# Patient Record
Sex: Female | Born: 1965 | Race: White | Hispanic: No | Marital: Married | Smoking: Current every day smoker
Health system: Southern US, Community
[De-identification: ages and names within clinical notes are randomized; demographics above are authoritative.]

## PROBLEM LIST (undated history)

## (undated) DIAGNOSIS — M797 Fibromyalgia: Secondary | ICD-10-CM

## (undated) DIAGNOSIS — F988 Other specified behavioral and emotional disorders with onset usually occurring in childhood and adolescence: Secondary | ICD-10-CM

## (undated) DIAGNOSIS — F1110 Opioid abuse, uncomplicated: Secondary | ICD-10-CM

## (undated) DIAGNOSIS — F419 Anxiety disorder, unspecified: Secondary | ICD-10-CM

## (undated) DIAGNOSIS — F4310 Post-traumatic stress disorder, unspecified: Secondary | ICD-10-CM

## (undated) DIAGNOSIS — J449 Chronic obstructive pulmonary disease, unspecified: Secondary | ICD-10-CM
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
## 2008-02-25 ENCOUNTER — Other Ambulatory Visit: Payer: Self-pay

## 2008-02-26 ENCOUNTER — Inpatient Hospital Stay: Payer: Self-pay | Admitting: Internal Medicine

## 2008-03-04 ENCOUNTER — Ambulatory Visit: Payer: Self-pay | Admitting: Urology

## 2008-03-13 ENCOUNTER — Ambulatory Visit: Payer: Self-pay | Admitting: Urology

## 2008-03-20 ENCOUNTER — Ambulatory Visit: Payer: Self-pay | Admitting: Urology

## 2008-04-04 ENCOUNTER — Ambulatory Visit: Payer: Self-pay | Admitting: Urology

## 2008-04-21 ENCOUNTER — Ambulatory Visit: Payer: Self-pay | Admitting: Urology

## 2008-05-01 ENCOUNTER — Ambulatory Visit: Payer: Self-pay | Admitting: Urology

## 2008-06-04 ENCOUNTER — Emergency Department: Payer: Self-pay | Admitting: Emergency Medicine

## 2011-03-13 ENCOUNTER — Emergency Department: Payer: Self-pay | Admitting: Emergency Medicine

## 2011-03-16 ENCOUNTER — Emergency Department: Payer: Self-pay | Admitting: Emergency Medicine

## 2012-02-10 ENCOUNTER — Ambulatory Visit: Payer: Self-pay

## 2013-04-29 ENCOUNTER — Ambulatory Visit: Payer: Self-pay

## 2013-07-30 ENCOUNTER — Ambulatory Visit: Payer: Self-pay | Admitting: Family Medicine

## 2013-09-21 DIAGNOSIS — F4310 Post-traumatic stress disorder, unspecified: Secondary | ICD-10-CM | POA: Insufficient documentation

## 2013-09-21 DIAGNOSIS — F329 Major depressive disorder, single episode, unspecified: Secondary | ICD-10-CM | POA: Insufficient documentation

## 2013-09-21 DIAGNOSIS — G4700 Insomnia, unspecified: Secondary | ICD-10-CM | POA: Insufficient documentation

## 2014-04-03 DIAGNOSIS — F909 Attention-deficit hyperactivity disorder, unspecified type: Secondary | ICD-10-CM | POA: Insufficient documentation

## 2014-05-29 DIAGNOSIS — F1390 Sedative, hypnotic, or anxiolytic use, unspecified, uncomplicated: Secondary | ICD-10-CM | POA: Insufficient documentation

## 2014-05-29 DIAGNOSIS — F1310 Sedative, hypnotic or anxiolytic abuse, uncomplicated: Secondary | ICD-10-CM

## 2014-05-29 DIAGNOSIS — F419 Anxiety disorder, unspecified: Secondary | ICD-10-CM | POA: Insufficient documentation

## 2014-09-18 ENCOUNTER — Ambulatory Visit: Payer: Self-pay | Admitting: Family Medicine

## 2015-11-27 DIAGNOSIS — J449 Chronic obstructive pulmonary disease, unspecified: Secondary | ICD-10-CM | POA: Insufficient documentation

## 2016-02-18 DIAGNOSIS — M47816 Spondylosis without myelopathy or radiculopathy, lumbar region: Secondary | ICD-10-CM | POA: Insufficient documentation

## 2016-02-18 DIAGNOSIS — M546 Pain in thoracic spine: Secondary | ICD-10-CM

## 2016-02-18 DIAGNOSIS — G8929 Other chronic pain: Secondary | ICD-10-CM | POA: Insufficient documentation

## 2016-09-06 ENCOUNTER — Emergency Department
Admission: EM | Admit: 2016-09-06 | Discharge: 2016-09-06 | Disposition: A | Discharge status: Home / Self Care | Payer: Medicaid Other | Attending: Emergency Medicine | Admitting: Emergency Medicine

## 2016-09-06 ENCOUNTER — Encounter: Payer: Self-pay | Admitting: Emergency Medicine

## 2016-09-06 ENCOUNTER — Emergency Department: Payer: Medicaid Other

## 2016-09-06 DIAGNOSIS — J441 Chronic obstructive pulmonary disease with (acute) exacerbation: Secondary | ICD-10-CM | POA: Diagnosis not present

## 2016-09-06 DIAGNOSIS — F17200 Nicotine dependence, unspecified, uncomplicated: Secondary | ICD-10-CM | POA: Insufficient documentation

## 2016-09-06 DIAGNOSIS — R509 Fever, unspecified: Secondary | ICD-10-CM | POA: Insufficient documentation

## 2016-09-06 DIAGNOSIS — R0602 Shortness of breath: Secondary | ICD-10-CM | POA: Diagnosis present

## 2016-09-06 DIAGNOSIS — J101 Influenza due to other identified influenza virus with other respiratory manifestations: Secondary | ICD-10-CM | POA: Diagnosis not present

## 2016-09-06 DIAGNOSIS — J449 Chronic obstructive pulmonary disease, unspecified: Secondary | ICD-10-CM | POA: Insufficient documentation

## 2016-09-06 HISTORY — DX: Chronic obstructive pulmonary disease, unspecified: J44.9

## 2016-09-06 HISTORY — DX: Fibromyalgia: M79.7

## 2016-09-06 LAB — CBC WITH DIFFERENTIAL/PLATELET
BASOS ABS: 0 10*3/uL (ref 0–0.1)
BASOS PCT: 0 %
Eosinophils Absolute: 0 10*3/uL (ref 0–0.7)
Eosinophils Relative: 0 %
HCT: 45 % (ref 35.0–47.0)
Hemoglobin: 16.4 g/dL — ABNORMAL HIGH (ref 12.0–16.0)
Lymphocytes Relative: 17 %
Lymphs Abs: 1.4 10*3/uL (ref 1.0–3.6)
MCH: 36.6 pg — ABNORMAL HIGH (ref 26.0–34.0)
MCHC: 36.6 g/dL — ABNORMAL HIGH (ref 32.0–36.0)
MCV: 100.1 fL — ABNORMAL HIGH (ref 80.0–100.0)
MONO ABS: 1 10*3/uL — AB (ref 0.2–0.9)
Monocytes Relative: 11 %
Neutro Abs: 6.1 10*3/uL (ref 1.4–6.5)
Neutrophils Relative %: 72 %
PLATELETS: 75 10*3/uL — AB (ref 150–440)
RBC: 4.49 MIL/uL (ref 3.80–5.20)
RDW: 15.7 % — AB (ref 11.5–14.5)
WBC: 8.5 10*3/uL (ref 3.6–11.0)

## 2016-09-06 LAB — LACTIC ACID, PLASMA
LACTIC ACID, VENOUS: 2.1 mmol/L — AB (ref 0.5–1.9)
LACTIC ACID, VENOUS: 2.2 mmol/L — AB (ref 0.5–1.9)

## 2016-09-06 LAB — URINALYSIS, COMPLETE (UACMP) WITH MICROSCOPIC
Bacteria, UA: NONE SEEN
Bilirubin Urine: NEGATIVE
GLUCOSE, UA: NEGATIVE mg/dL
Ketones, ur: NEGATIVE mg/dL
Leukocytes, UA: NEGATIVE
Nitrite: NEGATIVE
PROTEIN: NEGATIVE mg/dL
SPECIFIC GRAVITY, URINE: 1.009 (ref 1.005–1.030)
pH: 6 (ref 5.0–8.0)

## 2016-09-06 LAB — COMPREHENSIVE METABOLIC PANEL
ALBUMIN: 3.7 g/dL (ref 3.5–5.0)
ALK PHOS: 76 U/L (ref 38–126)
ALT: 48 U/L (ref 14–54)
AST: 134 U/L — AB (ref 15–41)
Anion gap: 10 (ref 5–15)
BILIRUBIN TOTAL: 1.9 mg/dL — AB (ref 0.3–1.2)
BUN: 6 mg/dL (ref 6–20)
CALCIUM: 8.2 mg/dL — AB (ref 8.9–10.3)
CO2: 26 mmol/L (ref 22–32)
Chloride: 94 mmol/L — ABNORMAL LOW (ref 101–111)
Creatinine, Ser: 0.61 mg/dL (ref 0.44–1.00)
GFR calc Af Amer: >60 mL/min (ref >60)
GLUCOSE: 189 mg/dL — AB (ref 65–99)
Potassium: 3.8 mmol/L (ref 3.5–5.1)
Sodium: 130 mmol/L — ABNORMAL LOW (ref 135–145)
TOTAL PROTEIN: 8.6 g/dL — AB (ref 6.5–8.1)

## 2016-09-06 LAB — INFLUENZA PANEL BY PCR (TYPE A & B)
Influenza A By PCR: NEGATIVE
Influenza B By PCR: POSITIVE — AB

## 2016-09-06 MED ORDER — SODIUM CHLORIDE 0.9 % IV BOLUS (SEPSIS)
1000.0000 mL | Freq: Once | INTRAVENOUS | Status: AC
  Administered 2016-09-06: 1000 mL via INTRAVENOUS

## 2016-09-06 MED ORDER — IPRATROPIUM-ALBUTEROL 0.5-2.5 (3) MG/3ML IN SOLN
3.0000 mL | Freq: Once | RESPIRATORY_TRACT | Status: AC
Start: 2016-09-06 — End: 2016-09-06
  Administered 2016-09-06: 3 mL via RESPIRATORY_TRACT
  Filled 2016-09-06: qty 3

## 2016-09-06 MED ORDER — DOXYCYCLINE HYCLATE 100 MG PO TABS
100.0000 mg | ORAL_TABLET | Freq: Once | ORAL | Status: AC
  Administered 2016-09-06: 100 mg via ORAL
  Filled 2016-09-06: qty 1

## 2016-09-06 MED ORDER — METHYLPREDNISOLONE SODIUM SUCC 125 MG IJ SOLR
125.0000 mg | Freq: Once | INTRAMUSCULAR | Status: AC
  Administered 2016-09-06: 125 mg via INTRAVENOUS
  Filled 2016-09-06: qty 2

## 2016-09-06 MED ORDER — SODIUM CHLORIDE 0.9 % IV BOLUS (SEPSIS)
500.0000 mL | Freq: Once | INTRAVENOUS | Status: AC
  Administered 2016-09-06: 500 mL via INTRAVENOUS

## 2016-09-06 MED ORDER — DOXYCYCLINE HYCLATE 100 MG PO CAPS: 100.0000 mg | ORAL_CAPSULE | Freq: Two times a day (BID) | ORAL | 0 refills | Status: AC

## 2016-09-06 MED ORDER — IPRATROPIUM-ALBUTEROL 0.5-2.5 (3) MG/3ML IN SOLN
3.0000 mL | Freq: Once | RESPIRATORY_TRACT | Status: AC
  Administered 2016-09-06: 3 mL via RESPIRATORY_TRACT
  Filled 2016-09-06: qty 3

## 2016-09-06 MED ORDER — ACETAMINOPHEN 500 MG PO TABS: 1000.0000 mg | ORAL_TABLET | Freq: Once | ORAL | Status: DC

## 2016-09-06 MED ORDER — ALBUTEROL SULFATE HFA 108 (90 BASE) MCG/ACT IN AERS: 2.0000 | INHALATION_SPRAY | Freq: Four times a day (QID) | RESPIRATORY_TRACT | 2 refills | Status: DC | PRN

## 2016-09-06 MED ORDER — OSELTAMIVIR PHOSPHATE 75 MG PO CAPS
75.0000 mg | ORAL_CAPSULE | Freq: Once | ORAL | Status: AC
Start: 2016-09-06 — End: 2016-09-06
  Administered 2016-09-06: 75 mg via ORAL
  Filled 2016-09-06: qty 1

## 2016-09-06 MED ORDER — ONDANSETRON HCL 4 MG PO TABS: 4.0000 mg | ORAL_TABLET | Freq: Three times a day (TID) | ORAL | 0 refills | Status: DC | PRN

## 2016-09-06 MED ORDER — PREDNISONE 20 MG PO TABS: 60.0000 mg | ORAL_TABLET | Freq: Every day | ORAL | 0 refills | Status: AC

## 2016-09-06 MED ORDER — OSELTAMIVIR PHOSPHATE 75 MG PO CAPS: 75.0000 mg | ORAL_CAPSULE | Freq: Two times a day (BID) | ORAL | 0 refills | Status: AC

## 2016-09-06 NOTE — Discharge Instructions (Signed)

## 2016-09-06 NOTE — ED Notes (Signed)
Pt maintaining oxygen levels at 94% room air.

## 2016-09-06 NOTE — ED Notes (Addendum)
Pt took oxygen off, oxygen 94% RA  Pt given a coke with ice per request.

## 2016-09-06 NOTE — ED Provider Notes (Signed)
The patient's repeat lactate is stable. She appears well and would like to go home. She is medically stable for outpatient management.   Merrily BrittleNeil Adrianne Shackleton, MD 09/06/16 [phone]

## 2016-09-06 NOTE — ED Triage Notes (Signed)
Pt to ed with c/o sob and cough and fever since Saturday.

## 2016-09-06 NOTE — ED Notes (Signed)
Pt oxygen turned off, will monitor oxygen saturation.   Pt eating a chickfila sandwich and drinking a ginger ale.

## 2016-09-06 NOTE — ED Provider Notes (Signed)
[facility] Emergency Department Provider Note  ____________________________________________  Time seen: Approximately 9:58 AM  I have reviewed the triage vital signs and the nursing notes.   HISTORY  Chief Complaint Shortness of Breath   HPI Victoria Kent is a 51 y.o. female a history of COPD and fibromyalgia who presents for evaluation of flulike symptoms. Patient reports 3 days of productive cough, wheezing, shortness of breath with exertion, fever, vomiting, diarrhea. She has been using her inhalers at home. She also has had body aches, chills, and headache. HAs not received flu shot this year. No known sick contacts. She denies chest pain, abdominal pain, dysuria. She currently endorses mild shortness of breath. She received 1 DuoNeb in route with EMS.  Past Medical History:  Diagnosis Date  . COPD (chronic obstructive pulmonary disease) (HCC)   . Fibromyalgia     There are no active problems to display for this patient.   History reviewed. No pertinent surgical history.  Prior to Admission medications   Medication Sig Start Date End Date Taking? Authorizing Provider  albuterol (PROVENTIL HFA;VENTOLIN HFA) 108 (90 Base) MCG/ACT inhaler Inhale 2 puffs into the lungs every 6 (six) hours as needed for wheezing. 09/06/16 09/06/17 Yes Historical Provider, MD  diazepam (VALIUM) 2 MG tablet Take 6-8 mg by mouth 2 (two) times daily. Take 6 mg in the morning and 8 mg at bedtime. 06/17/16  Yes Historical Provider, MD  gabapentin (NEURONTIN) 300 MG capsule Take 300 mg by mouth 4 (four) times daily. 07/29/16  Yes Historical Provider, MD  lisdexamfetamine (VYVANSE) 50 MG capsule Take 50 mg by mouth daily. 09/02/16  Yes Historical Provider, MD  SUBOXONE 8-2 MG FILM Place 1 Film under the tongue 3 (three) times daily. 07/30/16  Yes Historical Provider, MD  tiotropium (SPIRIVA) 18 MCG inhalation capsule Place 1 capsule into inhaler and inhale daily. 09/06/16 09/06/17  Yes Historical Provider, MD  albuterol (PROVENTIL HFA;VENTOLIN HFA) 108 (90 Base) MCG/ACT inhaler Inhale 2 puffs into the lungs every 6 (six) hours as needed for wheezing or shortness of breath. 09/06/16   Nita Sicklearolina Taaj Hurlbut, MD  doxycycline (VIBRAMYCIN) 100 MG capsule Take 1 capsule (100 mg total) by mouth 2 (two) times daily. 09/06/16 09/13/16  Nita Sicklearolina Daphnee Preiss, MD  ondansetron (ZOFRAN) 4 MG tablet Take 1 tablet (4 mg total) by mouth every 8 (eight) hours as needed for nausea or vomiting. 09/06/16   Nita Sicklearolina Maeola Mchaney, MD  oseltamivir (TAMIFLU) 75 MG capsule Take 1 capsule (75 mg total) by mouth 2 (two) times daily. 09/06/16 09/11/16  Nita Sicklearolina Amrie Gurganus, MD  predniSONE (DELTASONE) 20 MG tablet Take 3 tablets (60 mg total) by mouth daily. 09/06/16 09/10/16  Nita Sicklearolina Clyda Smyth, MD    Allergies Patient has no known allergies.  History reviewed. No pertinent family history.  Social History Social History  Substance Use Topics  . Smoking status: Current Every Day Smoker  . Smokeless tobacco: Never Used  . Alcohol use Yes    Review of Systems  Constitutional: + fever, chills, body aches Eyes: Negative for visual changes. ENT: Negative for sore throat. Neck: No neck pain  Cardiovascular: Negative for chest pain. Respiratory: + shortness of breath, wheezing, cough Gastrointestinal: Negative for abdominal pain. + vomiting and diarrhea. Genitourinary: Negative for dysuria. Musculoskeletal: Negative for back pain. Skin: Negative for rash. Neurological: Negative for weakness or numbness. + HA Psych: No SI or HI  ____________________________________________   PHYSICAL EXAM:  VITAL SIGNS: ED Triage Vitals  Enc Vitals Group  BP 09/06/16 0938 (!) 142/82     Pulse Rate 09/06/16 0938 (!) 118     Resp 09/06/16 0938 (!) 28     Temp 09/06/16 0938 (!) 102.6 F (39.2 C)     Temp Source 09/06/16 0938 Oral     SpO2 09/06/16 0938 (!) 87 %     Weight 09/06/16 0945 170 lb (77.1 kg)     Height --        Head Circumference --      Peak Flow --      Pain Score 09/06/16 0945 8     Pain Loc --      Pain Edu? --      Excl. in GC? --     Constitutional: Alert and oriented. Well appearing and in no apparent distress. HEENT:      Head: Normocephalic and atraumatic.         Eyes: Conjunctivae are normal. Sclera is non-icteric. EOMI. PERRL      Mouth/Throat: Mucous membranes are moist.       Neck: Supple with no signs of meningismus. Cardiovascular: Regular rate and rhythm. No murmurs, gallops, or rubs. 2+ symmetrical distal pulses are present in all extremities. No JVD. Respiratory: Normal respiratory effort. Decreased air movement with expiratory wheezes, no crackles, normal sats, normal work of breathing  Gastrointestinal: Soft, non tender, and non distended with positive bowel sounds. No rebound or guarding. Genitourinary: No CVA tenderness. Musculoskeletal: Nontender with normal range of motion in all extremities. No edema, cyanosis, or erythema of extremities. Neurologic: Normal speech and language. Face is symmetric. Moving all extremities. No gross focal neurologic deficits are appreciated. Skin: Skin is warm, dry and intact. No rash noted. Psychiatric: Mood and affect are normal. Speech and behavior are normal.  ____________________________________________   LABS (all labs ordered are listed, but only abnormal results are displayed)  Labs Reviewed  COMPREHENSIVE METABOLIC PANEL - Abnormal; Notable for the following:       Result Value   Sodium 130 (*)    Chloride 94 (*)    Glucose, Bld 189 (*)    Calcium 8.2 (*)    Total Protein 8.6 (*)    AST 134 (*)    Total Bilirubin 1.9 (*)    All other components within normal limits  CBC WITH DIFFERENTIAL/PLATELET - Abnormal; Notable for the following:    Hemoglobin 16.4 (*)    MCV 100.1 (*)    MCH 36.6 (*)    MCHC 36.6 (*)    RDW 15.7 (*)    Platelets 75 (*)    Monocytes Absolute 1.0 (*)    All other components within normal  limits  LACTIC ACID, PLASMA - Abnormal; Notable for the following:    Lactic Acid, Venous 2.1 (*)    All other components within normal limits  URINALYSIS, COMPLETE (UACMP) WITH MICROSCOPIC - Abnormal; Notable for the following:    Color, Urine YELLOW (*)    APPearance CLEAR (*)    Hgb urine dipstick MODERATE (*)    Squamous Epithelial / LPF 0-5 (*)    All other components within normal limits  INFLUENZA PANEL BY PCR (TYPE A & B) - Abnormal; Notable for the following:    Influenza B By PCR POSITIVE (*)    All other components within normal limits  CULTURE, BLOOD (ROUTINE X 2)  CULTURE, BLOOD (ROUTINE X 2)  URINE CULTURE  LACTIC ACID, PLASMA  LACTIC ACID, PLASMA   ____________________________________________  EKG  none ____________________________________________  RADIOLOGY  CXR: negative  ____________________________________________   PROCEDURES  Procedure(s) performed: None Procedures Critical Care performed: yes  CRITICAL CARE Performed by: Nita Sickle  ?  Total critical care time: 35 min  Critical care time was exclusive of separately billable procedures and treating other patients.  Critical care was necessary to treat or prevent imminent or life-threatening deterioration.  Critical care was time spent personally by me on the following activities: development of treatment plan with patient and/or surrogate as well as nursing, discussions with consultants, evaluation of patient's response to treatment, examination of patient, obtaining history from patient or surrogate, ordering and performing treatments and interventions, ordering and review of laboratory studies, ordering and review of radiographic studies, pulse oximetry and re-evaluation of patient's condition.  ____________________________________________   INITIAL IMPRESSION / ASSESSMENT AND PLAN / ED COURSE  51 y.o. female a history of COPD and fibromyalgia who presents for evaluation of flulike  symptoms x 3 days. Patient with a COPD exacerbation, febrile to 102.65F, tachycardiac to 118, she is moving good air has diffuse expiratory wheezes. Patient meets sepsis criteria. Sepsis protocol initiated. Presentation concerning for flu versus pneumonia. We'll give IV fluids, DuoNeb, Solu-Medrol, tylenol. Flu and CXR pending. Will hold off abx in case this is Influenza only with no PNA.  Clinical Course as of Sep 06 1501  Tue Sep 06, 2016  1103 Patient positive for Influenza B, lactic acid 2.1, CXR with no evidence of PNA. Patient given doxy for COPD exacerbation. Will repeat vitals and lactic after IVF and reassess for need of admission. Patient given tamiflu.  [CV]  1502 Patient feels improved, moving good air with no wheezing. Normal sats. She is still receiving IV fluids. Plan to repeat lactate and vital signs after she is done with fluids and reassess her respiratory status. If patient continues to look well with normal vital signs and normal lactate we'll discharge her home with prescriptions for albuterol, prednisone, doxycycline for COPD exacerbation and Tamiflu for influenza. Care transferred to Dr. Lamont Snowball  [CV]    Clinical Course User Index [CV] Nita Sickle, MD    Pertinent labs & imaging results that were available during my care of the patient were reviewed by me and considered in my medical decision making (see chart for details).    ____________________________________________   FINAL CLINICAL IMPRESSION(S) / ED DIAGNOSES  Final diagnoses:  Influenza B  COPD exacerbation (HCC)      NEW MEDICATIONS STARTED DURING THIS VISIT:  New Prescriptions   ALBUTEROL (PROVENTIL HFA;VENTOLIN HFA) 108 (90 BASE) MCG/ACT INHALER    Inhale 2 puffs into the lungs every 6 (six) hours as needed for wheezing or shortness of breath.   DOXYCYCLINE (VIBRAMYCIN) 100 MG CAPSULE    Take 1 capsule (100 mg total) by mouth 2 (two) times daily.   ONDANSETRON (ZOFRAN) 4 MG TABLET    Take 1  tablet (4 mg total) by mouth every 8 (eight) hours as needed for nausea or vomiting.   OSELTAMIVIR (TAMIFLU) 75 MG CAPSULE    Take 1 capsule (75 mg total) by mouth 2 (two) times daily.   PREDNISONE (DELTASONE) 20 MG TABLET    Take 3 tablets (60 mg total) by mouth daily.     Note:  This document was prepared using Dragon voice recognition software and may include unintentional dictation errors.    Nita Sickle, MD 09/06/16 [phone]

## 2016-09-06 NOTE — ED Notes (Signed)
Pt oxygen sustaining at 89% RA, placed on 1 L nasal cannula and will continue to monitor.

## 2016-09-07 LAB — URINE CULTURE: Culture: <10000 — AB

## 2016-09-11 LAB — CULTURE, BLOOD (ROUTINE X 2)
CULTURE: NO GROWTH
Culture: NO GROWTH

## 2017-01-09 ENCOUNTER — Ambulatory Visit
Admission: RE | Admit: 2017-01-09 | Discharge: 2017-01-09 | Disposition: A | Discharge status: Home / Self Care | Payer: Medicaid Other | Source: Ambulatory Visit | Attending: Oncology | Admitting: Oncology

## 2017-01-09 ENCOUNTER — Inpatient Hospital Stay: Payer: Medicaid Other | Attending: Oncology | Admitting: Oncology

## 2017-01-09 ENCOUNTER — Inpatient Hospital Stay: Payer: Medicaid Other

## 2017-01-09 ENCOUNTER — Other Ambulatory Visit: Payer: Self-pay | Admitting: *Deleted

## 2017-01-09 VITALS — BP 136/87 | HR 89 | Temp 98.2°F | Resp 18 | Wt 171.3 lb

## 2017-01-09 DIAGNOSIS — M47816 Spondylosis without myelopathy or radiculopathy, lumbar region: Secondary | ICD-10-CM | POA: Diagnosis not present

## 2017-01-09 DIAGNOSIS — D751 Secondary polycythemia: Secondary | ICD-10-CM

## 2017-01-09 DIAGNOSIS — F329 Major depressive disorder, single episode, unspecified: Secondary | ICD-10-CM

## 2017-01-09 DIAGNOSIS — F17210 Nicotine dependence, cigarettes, uncomplicated: Secondary | ICD-10-CM | POA: Diagnosis not present

## 2017-01-09 DIAGNOSIS — G8929 Other chronic pain: Secondary | ICD-10-CM | POA: Diagnosis not present

## 2017-01-09 DIAGNOSIS — D696 Thrombocytopenia, unspecified: Secondary | ICD-10-CM | POA: Diagnosis not present

## 2017-01-09 DIAGNOSIS — R05 Cough: Secondary | ICD-10-CM

## 2017-01-09 DIAGNOSIS — M546 Pain in thoracic spine: Secondary | ICD-10-CM

## 2017-01-09 DIAGNOSIS — M797 Fibromyalgia: Secondary | ICD-10-CM

## 2017-01-09 DIAGNOSIS — J449 Chronic obstructive pulmonary disease, unspecified: Secondary | ICD-10-CM | POA: Diagnosis not present

## 2017-01-09 DIAGNOSIS — G4700 Insomnia, unspecified: Secondary | ICD-10-CM | POA: Diagnosis not present

## 2017-01-09 DIAGNOSIS — F909 Attention-deficit hyperactivity disorder, unspecified type: Secondary | ICD-10-CM

## 2017-01-09 DIAGNOSIS — F4310 Post-traumatic stress disorder, unspecified: Secondary | ICD-10-CM

## 2017-01-09 DIAGNOSIS — R0781 Pleurodynia: Secondary | ICD-10-CM | POA: Diagnosis not present

## 2017-01-09 DIAGNOSIS — R059 Cough, unspecified: Secondary | ICD-10-CM

## 2017-01-09 DIAGNOSIS — R0789 Other chest pain: Secondary | ICD-10-CM

## 2017-01-09 DIAGNOSIS — Z79899 Other long term (current) drug therapy: Secondary | ICD-10-CM

## 2017-01-09 LAB — CBC
HEMATOCRIT: 47.5 % — AB (ref 35.0–47.0)
HEMOGLOBIN: 16.5 g/dL — AB (ref 12.0–16.0)
MCH: 35.3 pg — AB (ref 26.0–34.0)
MCHC: 34.7 g/dL (ref 32.0–36.0)
MCV: 101.6 fL — ABNORMAL HIGH (ref 80.0–100.0)
Platelets: 85 10*3/uL — ABNORMAL LOW (ref 150–440)
RBC: 4.68 MIL/uL (ref 3.80–5.20)
RDW: 14.6 % — ABNORMAL HIGH (ref 11.5–14.5)
WBC: 6.6 10*3/uL (ref 3.6–11.0)

## 2017-01-09 LAB — FOLATE: Folate: 4.2 ng/mL — ABNORMAL LOW (ref >5.9)

## 2017-01-09 NOTE — Progress Notes (Signed)
I faxed over the cxr results to [facility] in [place].
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
## 2017-01-09 NOTE — Progress Notes (Signed)
Please let pcp know that she has left chest wall pain. CXR shows no acute fracture. Pcp can order rib xrays if need be

## 2017-01-10 ENCOUNTER — Other Ambulatory Visit: Payer: Self-pay | Admitting: Family Medicine

## 2017-01-10 ENCOUNTER — Encounter: Payer: Self-pay | Admitting: Oncology

## 2017-01-10 ENCOUNTER — Telehealth: Payer: Self-pay | Admitting: *Deleted

## 2017-01-10 DIAGNOSIS — F1120 Opioid dependence, uncomplicated: Secondary | ICD-10-CM | POA: Insufficient documentation

## 2017-01-10 DIAGNOSIS — R1901 Right upper quadrant abdominal swelling, mass and lump: Secondary | ICD-10-CM

## 2017-01-10 LAB — VITAMIN B12: VITAMIN B 12: 488 pg/mL (ref 180–914)

## 2017-01-10 LAB — HEPATITIS B CORE ANTIBODY, TOTAL: HEP B C TOTAL AB: NEGATIVE

## 2017-01-10 LAB — HEPATITIS C ANTIBODY: HCV Ab: 0.2 s/co ratio (ref 0.0–0.9)

## 2017-01-10 LAB — HEPATITIS B SURFACE ANTIGEN: Hepatitis B Surface Ag: NEGATIVE

## 2017-01-10 LAB — HIV ANTIBODY (ROUTINE TESTING W REFLEX): HIV Screen 4th Generation wRfx: NONREACTIVE

## 2017-01-10 LAB — ERYTHROPOIETIN: Erythropoietin: 10 m[IU]/mL (ref 2.6–18.5)

## 2017-01-10 NOTE — Telephone Encounter (Signed)
-----   Message from Corene CorneaSharon Y Venable, RN sent at 01/10/2017  4:27 PM EDT -----   ----- Message ----- From: Creig Hinesao, Archana C, MD Sent: 01/10/2017   8:26 AM To: Corene CorneaSharon Y Venable, RN  Please have her start taking oral folate supplements 1mg  daily

## 2017-01-10 NOTE — Progress Notes (Signed)
Please have her start taking oral folate supplements 1mg  daily

## 2017-01-10 NOTE — Telephone Encounter (Signed)
Called patient to inform her that MD recommends she start Folate 1 g daily. While on the phone, she asked about her CXR results.  Informed her that there were no new fractures.  Results have been forwarded to PCP for review.  Patient verbalized understanding.

## 2017-01-11 ENCOUNTER — Encounter: Payer: Self-pay | Admitting: Family Medicine

## 2017-01-20 LAB — JAK2  V617F QUAL. WITH REFLEX TO EXON 12: Reflex:: 15

## 2017-01-20 LAB — JAK2 EXONS 12-15

## 2017-01-23 ENCOUNTER — Inpatient Hospital Stay: Payer: Medicaid Other | Attending: Oncology | Admitting: Oncology

## 2017-01-24 ENCOUNTER — Ambulatory Visit: Payer: Medicaid Other

## 2017-02-06 ENCOUNTER — Ambulatory Visit: Payer: Medicaid Other

## 2017-02-07 ENCOUNTER — Other Ambulatory Visit: Payer: Self-pay | Admitting: Family Medicine

## 2017-02-07 DIAGNOSIS — Z1239 Encounter for other screening for malignant neoplasm of breast: Secondary | ICD-10-CM

## 2017-02-15 ENCOUNTER — Ambulatory Visit: Payer: Medicaid Other

## 2017-02-20 ENCOUNTER — Ambulatory Visit: Admission: RE | Admit: 2017-02-20 | Payer: Medicaid Other | Source: Ambulatory Visit

## 2017-02-20 ENCOUNTER — Ambulatory Visit
Admission: RE | Admit: 2017-02-20 | Discharge: 2017-02-20 | Disposition: A | Discharge status: Home / Self Care | Payer: Medicaid Other | Source: Ambulatory Visit | Attending: Family Medicine | Admitting: Family Medicine

## 2017-02-20 DIAGNOSIS — R161 Splenomegaly, not elsewhere classified: Secondary | ICD-10-CM | POA: Diagnosis not present

## 2017-02-20 DIAGNOSIS — K769 Liver disease, unspecified: Secondary | ICD-10-CM | POA: Insufficient documentation

## 2017-02-20 DIAGNOSIS — R188 Other ascites: Secondary | ICD-10-CM | POA: Diagnosis not present

## 2017-02-20 DIAGNOSIS — R1901 Right upper quadrant abdominal swelling, mass and lump: Secondary | ICD-10-CM | POA: Diagnosis present

## 2017-03-30 ENCOUNTER — Ambulatory Visit: Payer: Medicaid Other | Admitting: Gastroenterology

## 2017-04-13 ENCOUNTER — Other Ambulatory Visit: Payer: Self-pay

## 2017-04-13 ENCOUNTER — Encounter: Payer: Self-pay | Admitting: Gastroenterology

## 2017-04-13 ENCOUNTER — Ambulatory Visit (INDEPENDENT_AMBULATORY_CARE_PROVIDER_SITE_OTHER): Payer: Medicaid Other | Admitting: Gastroenterology

## 2017-04-13 ENCOUNTER — Other Ambulatory Visit
Admission: RE | Admit: 2017-04-13 | Discharge: 2017-04-13 | Disposition: A | Discharge status: Home / Self Care | Payer: Medicaid Other | Source: Ambulatory Visit | Attending: Gastroenterology | Admitting: Gastroenterology

## 2017-04-13 ENCOUNTER — Encounter (INDEPENDENT_AMBULATORY_CARE_PROVIDER_SITE_OTHER): Payer: Self-pay

## 2017-04-13 VITALS — BP 156/81 | HR 97 | Temp 99.0°F | Ht 62.0 in | Wt 175.0 lb

## 2017-04-13 DIAGNOSIS — K7031 Alcoholic cirrhosis of liver with ascites: Secondary | ICD-10-CM

## 2017-04-13 DIAGNOSIS — K7030 Alcoholic cirrhosis of liver without ascites: Secondary | ICD-10-CM | POA: Insufficient documentation

## 2017-04-13 LAB — HEPATIC FUNCTION PANEL
ALT: 35 U/L (ref 14–54)
AST: 67 U/L — AB (ref 15–41)
Albumin: 4.1 g/dL (ref 3.5–5.0)
Alkaline Phosphatase: 93 U/L (ref 38–126)
BILIRUBIN INDIRECT: 1.6 mg/dL — AB (ref 0.3–0.9)
Bilirubin, Direct: 0.3 mg/dL (ref 0.1–0.5)
TOTAL PROTEIN: 8.4 g/dL — AB (ref 6.5–8.1)
Total Bilirubin: 1.9 mg/dL — ABNORMAL HIGH (ref 0.3–1.2)
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
## 2017-04-14 LAB — AFP TUMOR MARKER: AFP, SERUM, TUMOR MARKER: 5.9 ng/mL (ref 0.0–8.3)

## 2017-04-14 LAB — ALPHA-1-ANTITRYPSIN: A-1 Antitrypsin, Ser: 170 mg/dL (ref 90–200)

## 2017-04-14 LAB — ANTINUCLEAR ANTIBODIES, IFA: ANTINUCLEAR ANTIBODIES, IFA: NEGATIVE

## 2017-04-14 LAB — ANTI-SMOOTH MUSCLE ANTIBODY, IGG: F-ACTIN AB IGG: 30 U — AB (ref 0–19)

## 2017-04-14 LAB — MITOCHONDRIAL ANTIBODIES: Mitochondrial M2 Ab, IgG: 16.9 Units (ref 0.0–20.0)

## 2017-04-14 LAB — HEPATITIS A ANTIBODY, TOTAL: Hep A Total Ab: NEGATIVE

## 2017-04-14 LAB — CERULOPLASMIN: Ceruloplasmin: 29.2 mg/dL (ref 19.0–39.0)

## 2017-04-17 ENCOUNTER — Telehealth: Payer: Self-pay

## 2017-04-17 NOTE — Telephone Encounter (Signed)
Left message with pt's mother for her to call me back. She stated she would be gone until Wednesday and I could call her back then.

## 2017-04-17 NOTE — Telephone Encounter (Signed)
-----   Message from Midge Minium, MD sent at 04/16/2017  9:18 AM EDT ----- With the patient know that her liver enzymes have come down somewhat better still elevated.  Have her check it in one month after she has abstained from alcohol.

## 2017-04-20 NOTE — Telephone Encounter (Signed)
Left vm for pt to return my call.  

## 2017-04-20 NOTE — Telephone Encounter (Signed)
Pt returned my call and was advised of lab results.  

## 2017-08-17 ENCOUNTER — Telehealth: Payer: Self-pay

## 2017-08-17 NOTE — Telephone Encounter (Signed)
-----   Message from Rayann HemanGinger Kenzy Campoverde, CMA sent at 04/20/2017  3:22 PM EDT ----- Pt needs repeat LFT's.

## 2017-08-17 NOTE — Telephone Encounter (Signed)
Spoke with pt regarding the repeat LFT labs. Pt stated [facility]. I will check on those to see if liver enzymes were done.

## 2018-02-13 ENCOUNTER — Other Ambulatory Visit: Payer: Self-pay | Admitting: Family Medicine

## 2018-02-13 DIAGNOSIS — R945 Abnormal results of liver function studies: Principal | ICD-10-CM

## 2018-02-13 DIAGNOSIS — R7989 Other specified abnormal findings of blood chemistry: Secondary | ICD-10-CM

## 2018-02-20 ENCOUNTER — Ambulatory Visit
Admission: RE | Admit: 2018-02-20 | Discharge: 2018-02-20 | Disposition: A | Discharge status: Home / Self Care | Payer: Medicaid Other | Source: Ambulatory Visit | Attending: Family Medicine | Admitting: Family Medicine

## 2018-02-20 DIAGNOSIS — R945 Abnormal results of liver function studies: Secondary | ICD-10-CM | POA: Insufficient documentation

## 2018-02-20 DIAGNOSIS — R7989 Other specified abnormal findings of blood chemistry: Secondary | ICD-10-CM

## 2018-08-28 ENCOUNTER — Other Ambulatory Visit: Payer: Self-pay | Admitting: Family Medicine

## 2018-08-29 ENCOUNTER — Encounter: Payer: Self-pay | Admitting: Hematology and Oncology

## 2018-08-29 ENCOUNTER — Inpatient Hospital Stay: Payer: Medicaid Other | Attending: Hematology and Oncology | Admitting: Hematology and Oncology

## 2018-08-29 ENCOUNTER — Inpatient Hospital Stay: Payer: Medicaid Other

## 2018-08-29 VITALS — BP 157/87 | HR 91 | Resp 18 | Ht 62.0 in | Wt 176.4 lb

## 2018-08-29 DIAGNOSIS — F17210 Nicotine dependence, cigarettes, uncomplicated: Secondary | ICD-10-CM | POA: Insufficient documentation

## 2018-08-29 DIAGNOSIS — R791 Abnormal coagulation profile: Secondary | ICD-10-CM | POA: Insufficient documentation

## 2018-08-29 DIAGNOSIS — D751 Secondary polycythemia: Secondary | ICD-10-CM | POA: Diagnosis not present

## 2018-08-29 DIAGNOSIS — D696 Thrombocytopenia, unspecified: Secondary | ICD-10-CM | POA: Insufficient documentation

## 2018-08-29 DIAGNOSIS — E538 Deficiency of other specified B group vitamins: Secondary | ICD-10-CM | POA: Insufficient documentation

## 2018-08-29 DIAGNOSIS — F1010 Alcohol abuse, uncomplicated: Secondary | ICD-10-CM | POA: Insufficient documentation

## 2018-08-29 DIAGNOSIS — K7460 Unspecified cirrhosis of liver: Secondary | ICD-10-CM | POA: Diagnosis not present

## 2018-08-29 DIAGNOSIS — J449 Chronic obstructive pulmonary disease, unspecified: Secondary | ICD-10-CM | POA: Diagnosis not present

## 2018-08-29 DIAGNOSIS — Z79899 Other long term (current) drug therapy: Secondary | ICD-10-CM | POA: Diagnosis not present

## 2018-08-29 DIAGNOSIS — R161 Splenomegaly, not elsewhere classified: Secondary | ICD-10-CM | POA: Diagnosis not present

## 2018-08-29 LAB — CBC WITH DIFFERENTIAL/PLATELET
Abs Immature Granulocytes: 0.01 10*3/uL (ref 0.00–0.07)
Basophils Absolute: 0 10*3/uL (ref 0.0–0.1)
Basophils Relative: 1 %
Eosinophils Absolute: 0.1 10*3/uL (ref 0.0–0.5)
Eosinophils Relative: 1 %
HCT: 45.1 % (ref 36.0–46.0)
Hemoglobin: 16.9 g/dL — ABNORMAL HIGH (ref 12.0–15.0)
Immature Granulocytes: 0 %
Lymphocytes Relative: 43 %
Lymphs Abs: 2.4 10*3/uL (ref 0.7–4.0)
MCH: 36.7 pg — ABNORMAL HIGH (ref 26.0–34.0)
MCHC: 37.5 g/dL — ABNORMAL HIGH (ref 30.0–36.0)
MCV: 97.8 fL (ref 80.0–100.0)
Monocytes Absolute: 0.4 10*3/uL (ref 0.1–1.0)
Monocytes Relative: 8 %
Neutro Abs: 2.7 10*3/uL (ref 1.7–7.7)
Neutrophils Relative %: 47 %
Platelets: 80 10*3/uL — ABNORMAL LOW (ref 150–400)
RBC: 4.61 MIL/uL (ref 3.87–5.11)
RDW: 12.9 % (ref 11.5–15.5)
WBC: 5.7 10*3/uL (ref 4.0–10.5)
nRBC: 0 % (ref 0.0–0.2)

## 2018-08-29 LAB — COMPREHENSIVE METABOLIC PANEL
ALT: 56 U/L — ABNORMAL HIGH (ref 0–44)
AST: 99 U/L — ABNORMAL HIGH (ref 15–41)
Albumin: 4.3 g/dL (ref 3.5–5.0)
Alkaline Phosphatase: 91 U/L (ref 38–126)
Anion gap: 9 (ref 5–15)
BUN: 7 mg/dL (ref 6–20)
CO2: 29 mmol/L (ref 22–32)
Calcium: 8.8 mg/dL — ABNORMAL LOW (ref 8.9–10.3)
Chloride: 99 mmol/L (ref 98–111)
Creatinine, Ser: 0.5 mg/dL (ref 0.44–1.00)
GFR calc Af Amer: >60 mL/min (ref >60)
GFR calc non Af Amer: >60 mL/min (ref >60)
Glucose, Bld: 134 mg/dL — ABNORMAL HIGH (ref 70–99)
Potassium: 3.8 mmol/L (ref 3.5–5.1)
Sodium: 137 mmol/L (ref 135–145)
Total Bilirubin: 3.1 mg/dL — ABNORMAL HIGH (ref 0.3–1.2)
Total Protein: 8.8 g/dL — ABNORMAL HIGH (ref 6.5–8.1)

## 2018-08-29 LAB — PROTIME-INR
INR: 1.42
Prothrombin Time: 17.2 seconds — ABNORMAL HIGH (ref 11.4–15.2)

## 2018-08-29 LAB — APTT: aPTT: 39 seconds — ABNORMAL HIGH (ref 24–36)

## 2018-08-29 LAB — FOLATE: Folate: 3.5 ng/mL — ABNORMAL LOW (ref >5.9)

## 2018-08-29 LAB — BILIRUBIN, DIRECT: BILIRUBIN DIRECT: 0.5 mg/dL — AB (ref 0.0–0.2)

## 2018-08-29 LAB — VITAMIN B12: Vitamin B-12: 470 pg/mL (ref 180–914)
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
## 2018-08-30 ENCOUNTER — Other Ambulatory Visit: Payer: Self-pay

## 2018-08-30 ENCOUNTER — Other Ambulatory Visit: Payer: Self-pay | Admitting: Hematology and Oncology

## 2018-08-30 ENCOUNTER — Telehealth: Payer: Self-pay

## 2018-08-30 LAB — CARBON MONOXIDE, BLOOD (PERFORMED AT REF LAB): Carbon Monoxide, Blood: 8 % — ABNORMAL HIGH (ref 0.0–3.6)

## 2018-08-30 LAB — PROTEIN ELECTROPHORESIS, SERUM
A/G RATIO SPE: 0.9 (ref 0.7–1.7)
ALBUMIN ELP: 4 g/dL (ref 2.9–4.4)
ALPHA-2-GLOBULIN: 0.4 g/dL (ref 0.4–1.0)
Alpha-1-Globulin: 0.3 g/dL (ref 0.0–0.4)
BETA GLOBULIN: 1.3 g/dL (ref 0.7–1.3)
GAMMA GLOBULIN: 2.3 g/dL — AB (ref 0.4–1.8)
Globulin, Total: 4.4 g/dL — ABNORMAL HIGH (ref 2.2–3.9)
Total Protein ELP: 8.4 g/dL (ref 6.0–8.5)

## 2018-08-30 LAB — ERYTHROPOIETIN: Erythropoietin: 11.5 m[IU]/mL (ref 2.6–18.5)

## 2018-08-30 LAB — AFP TUMOR MARKER: AFP, Serum, Tumor Marker: 4.9 ng/mL (ref 0.0–8.3)

## 2018-08-30 NOTE — Telephone Encounter (Signed)
Contacted patient and informed her of low Folic Acid levels. Pt states she is not taking Folate at this time. Informed her Dr. Merlene Pullingorcoran would like for her to start taking Folate 1 MG Daily. Pt states understanding and denies any further questions or concerns.

## 2018-08-30 NOTE — Telephone Encounter (Signed)
-----   Message from Rosey Bath, MD sent at 08/30/2018  9:11 AM EST ----- Regarding: Please call patient  Folic acid is low.  Is she taking folate 1 mg a day? If no, please start. If yes, increase folate to 2 mg a day.  Recheck folate in 1 month.  M ----- Message ----- From: Leory Plowman, Lab In [place] Sent: 08/29/2018   3:31 PM EST To: Rosey Bath, MD

## 2018-09-03 ENCOUNTER — Ambulatory Visit
Admission: RE | Admit: 2018-09-03 | Discharge: 2018-09-03 | Disposition: A | Discharge status: Home / Self Care | Payer: Medicaid Other | Source: Ambulatory Visit | Attending: Hematology and Oncology | Admitting: Hematology and Oncology

## 2018-09-03 DIAGNOSIS — K7460 Unspecified cirrhosis of liver: Secondary | ICD-10-CM

## 2018-09-03 DIAGNOSIS — D696 Thrombocytopenia, unspecified: Secondary | ICD-10-CM | POA: Diagnosis present

## 2018-09-12 ENCOUNTER — Telehealth: Payer: Self-pay

## 2018-09-12 ENCOUNTER — Encounter: Payer: Self-pay | Admitting: Hematology and Oncology

## 2018-09-12 ENCOUNTER — Inpatient Hospital Stay (HOSPITAL_BASED_OUTPATIENT_CLINIC_OR_DEPARTMENT_OTHER): Payer: Medicaid Other | Admitting: Hematology and Oncology

## 2018-09-12 VITALS — BP 142/90 | HR 101 | Temp 98.6°F | Resp 18 | Ht 62.0 in | Wt 176.4 lb

## 2018-09-12 DIAGNOSIS — F17210 Nicotine dependence, cigarettes, uncomplicated: Secondary | ICD-10-CM

## 2018-09-12 DIAGNOSIS — K7460 Unspecified cirrhosis of liver: Secondary | ICD-10-CM

## 2018-09-12 DIAGNOSIS — R161 Splenomegaly, not elsewhere classified: Secondary | ICD-10-CM

## 2018-09-12 DIAGNOSIS — J449 Chronic obstructive pulmonary disease, unspecified: Secondary | ICD-10-CM

## 2018-09-12 DIAGNOSIS — E538 Deficiency of other specified B group vitamins: Secondary | ICD-10-CM | POA: Diagnosis not present

## 2018-09-12 DIAGNOSIS — F1010 Alcohol abuse, uncomplicated: Secondary | ICD-10-CM

## 2018-09-12 DIAGNOSIS — D751 Secondary polycythemia: Secondary | ICD-10-CM | POA: Diagnosis not present

## 2018-09-12 DIAGNOSIS — R791 Abnormal coagulation profile: Secondary | ICD-10-CM

## 2018-09-12 DIAGNOSIS — D696 Thrombocytopenia, unspecified: Secondary | ICD-10-CM

## 2018-09-12 MED ORDER — FOLIC ACID 1 MG PO TABS: 1.0000 mg | ORAL_TABLET | Freq: Every day | ORAL | 0 refills | Status: DC

## 2018-09-12 NOTE — Telephone Encounter (Signed)
Spoke with Victoria Kent to inform her that the Folic acid 1 gm has been sent over to [facility] in [place]. The patient was understanding and agreeable to get the Folic acid.
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
## 2018-09-12 NOTE — Progress Notes (Signed)
No new changes noted today 

## 2018-09-12 NOTE — Telephone Encounter (Signed)
-----   Message from [facility] sent at 09/12/2018  3:08 PM EST ----- Regarding: folic acid Victoria Kent called and said she went to 2 drug stores and they did not have the 1 mg Folic Acid but will have to send RX because ita behind the counter medication. [facility] [place]

## 2018-09-12 NOTE — Patient Instructions (Signed)
Please start taking FOLIC ACID 1 mg daily.

## 2018-09-18 ENCOUNTER — Ambulatory Visit
Admission: EM | Admit: 2018-09-18 | Discharge: 2018-09-18 | Disposition: A | Discharge status: Home / Self Care | Payer: Medicaid Other | Attending: Family Medicine | Admitting: Family Medicine

## 2018-09-18 ENCOUNTER — Other Ambulatory Visit: Payer: Self-pay

## 2018-09-18 ENCOUNTER — Encounter: Payer: Self-pay | Admitting: Emergency Medicine

## 2018-09-18 DIAGNOSIS — R51 Headache: Secondary | ICD-10-CM

## 2018-09-18 DIAGNOSIS — F17210 Nicotine dependence, cigarettes, uncomplicated: Secondary | ICD-10-CM

## 2018-09-18 DIAGNOSIS — J441 Chronic obstructive pulmonary disease with (acute) exacerbation: Secondary | ICD-10-CM | POA: Insufficient documentation

## 2018-09-18 DIAGNOSIS — R05 Cough: Secondary | ICD-10-CM | POA: Diagnosis not present

## 2018-09-18 DIAGNOSIS — Z8709 Personal history of other diseases of the respiratory system: Secondary | ICD-10-CM

## 2018-09-18 DIAGNOSIS — R69 Illness, unspecified: Secondary | ICD-10-CM | POA: Diagnosis not present

## 2018-09-18 DIAGNOSIS — J029 Acute pharyngitis, unspecified: Secondary | ICD-10-CM

## 2018-09-18 DIAGNOSIS — J111 Influenza due to unidentified influenza virus with other respiratory manifestations: Secondary | ICD-10-CM

## 2018-09-18 HISTORY — DX: Other specified behavioral and emotional disorders with onset usually occurring in childhood and adolescence: F98.8

## 2018-09-18 HISTORY — DX: Opioid abuse, uncomplicated: F11.10

## 2018-09-18 HISTORY — DX: Anxiety disorder, unspecified: F41.9

## 2018-09-18 LAB — RAPID STREP SCREEN (MED CTR MEBANE ONLY): STREPTOCOCCUS, GROUP A SCREEN (DIRECT): NEGATIVE

## 2018-09-18 LAB — RAPID INFLUENZA A&B ANTIGENS
Influenza A (ARMC): NEGATIVE
Influenza B (ARMC): NEGATIVE

## 2018-09-18 MED ORDER — BENZONATATE 200 MG PO CAPS: 200.0000 mg | ORAL_CAPSULE | Freq: Three times a day (TID) | ORAL | 0 refills | Status: DC | PRN

## 2018-09-18 MED ORDER — PREDNISONE 20 MG PO TABS: 40.0000 mg | ORAL_TABLET | Freq: Every day | ORAL | 0 refills | Status: DC

## 2018-09-18 MED ORDER — OSELTAMIVIR PHOSPHATE 75 MG PO CAPS: 75.0000 mg | ORAL_CAPSULE | Freq: Two times a day (BID) | ORAL | 0 refills | Status: DC

## 2018-09-18 NOTE — ED Triage Notes (Addendum)
Patient in today c/o cough, sore throat, headache and body aches x 2 days. Patient has had chills, but hasn't checked her temperature. Patient states she drank Theraflu this morning, but no other OTC medications.  Patient states her son has had the same illness, but has not been to the doctor and son's girlfriend had the flu.

## 2018-09-18 NOTE — Discharge Instructions (Signed)
Take medication as prescribed. Rest. Drink plenty of fluids. Continue inhalers. Tylenol   Follow up with your primary care physician this week for follow up. Return to Urgent care for new or worsening concerns.

## 2018-09-18 NOTE — ED Provider Notes (Signed)
[facility] ____________________________________________  Time seen: Approximately 1:39 PM  I have reviewed the triage vital signs and the nursing notes.   HISTORY  Chief Complaint Cough; Sore Throat; Generalized Body Aches; and Headache   HPI Victoria Kent is a 53 y.o. female history of COPD, presenting for evaluation of 2 days of quick onset of runny nose, nasal congestion, cough, sore throat, chills and some body aches.  States has felt like she has had a fever but denies known fevers.  Has taken some over-the-counter TheraFlu which helps some, last took approximately 2 hours prior to arrival.  Overall continues to eat and drink well.  States felt completely fine prior to 2 days ago.  Sore throat currently mild.  States some intermittent wheezing, states that feels consistent with her COPD.  Did use albuterol treatment this morning which helped.  Denies chest pain.  Some shortness of breath with coughing, denies other shortness of breath.  Cough mostly Hack[place].  States that her son had similar sickness prior to her onset.  Denies other known direct sick contacts.  Denies recent sickness.  Denies recent COPD exacerbation.  Denies renal insufficiency.  Titus Mould, NP: PCP    Past Medical History:  Diagnosis Date  . ADD (attention deficit disorder)   . Anxiety   . COPD (chronic obstructive pulmonary disease) (HCC)   . Drug abuse, opioid type (HCC)   . Fibromyalgia     Patient Active Problem List   Diagnosis Date Noted  . Erythrocytosis 08/29/2018  . Thrombocytopenia (HCC) 08/29/2018  . Narcotic addiction (HCC) 01/10/2017  . Fibromyalgia 01/09/2017  . Chronic obstructive pulmonary disease (HCC) 09/06/2016  . Chronic midline thoracic back pain 02/18/2016  . 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 COPD (chronic obstructive pulmonary disease) (HCC) 11/27/2015  . Anxiety 05/29/2014  . 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 ADHD (attention deficit hyperactivity disorder) 04/03/2014  . Insomnia 09/21/2013  . PTSD (post-traumatic stress disorder) 09/21/2013  . MDD (major depressive disorder) 09/21/2013  . Chronic pain 10/11/2009    Past Surgical History:  Procedure Laterality Date  . CESAREAN SECTION       No current facility-administered medications for this encounter.   Current Outpatient Medications:  .  albuterol (PROVENTIL HFA;VENTOLIN HFA) 108 (90 Base) MCG/ACT inhaler, Inhale 2 puffs into the lungs every 6 (six) hours as needed for wheezing., Disp: , Rfl:  .  diazepam (VALIUM) 2 MG tablet, Take 6-8 mg by mouth 2 (two) times daily. Take 6 mg in the morning and 8 mg at bedtime., Disp: , Rfl: 0 .  FLOVENT HFA 110 MCG/ACT inhaler, INHALE 1 INHALATION INTO THE LUNGS 2 (TWO) TIMES DAILY, Disp: , Rfl:  .  folic acid (FOLVITE) 1 MG tablet, Take 1 tablet (1 mg total) by mouth daily., Disp: 90 tablet, Rfl: 0 .  gabapentin (NEURONTIN) 300 MG capsule, Take 300 mg by mouth 4 (four) times daily., Disp: , Rfl: 99 .  lisdexamfetamine (VYVANSE) 50 MG capsule, Take 50 mg by mouth daily., Disp: , Rfl:  .  naloxone (NARCAN) nasal spray 4 mg/0.1 mL, Place into the nose., Disp: , Rfl:  .  SUBOXONE 8-2 MG FILM, Place 1 Film under the tongue 3 (three) times daily., Disp: , Rfl: 1 .  tiotropium (SPIRIVA) 18 MCG inhalation capsule, Place 1 capsule into inhaler and inhale daily., Disp: , Rfl:  .  albuterol (PROVENTIL HFA;VENTOLIN HFA) 108 (90 Base) MCG/ACT inhaler, Inhale 2 puffs into  the lungs every 6 (six) hours as needed for wheezing or shortness of breath. (Patient not taking: Reported on 08/29/2018), Disp: 1 Inhaler, Rfl: 2 .  benzonatate (TESSALON) 200 MG capsule, Take 1 capsule (200 mg total) by mouth 3 (three) times daily as needed for cough., Disp: 20 capsule, Rfl: 0 .  ondansetron (ZOFRAN) 4 MG tablet, Take 1 tablet (4 mg total) by mouth every 8 (eight) hours as needed for nausea or vomiting. (Patient not  taking: Reported on 01/09/2017), Disp: 20 tablet, Rfl: 0 .  oseltamivir (TAMIFLU) 75 MG capsule, Take 1 capsule (75 mg total) by mouth every 12 (twelve) hours., Disp: 10 capsule, Rfl: 0 .  predniSONE (DELTASONE) 20 MG tablet, Take 2 tablets (40 mg total) by mouth daily., Disp: 10 tablet, Rfl: 0  Allergies Patient has no known allergies.  Family History  Problem Relation Age of Onset  . Skin cancer Mother        melanoma  . Diabetes Mother   . Other Father        unknown medical history    Social History Social History   Tobacco Use  . Smoking status: Current Every Day Smoker    Packs/day: 1.00    Years: 35.00    Pack years: 35.00    Types: Cigarettes  . Smokeless tobacco: Never Used  Substance Use Topics  . Alcohol use: Yes    Comment: social  . Drug use: Not Currently    Types: Cocaine    Comment: last use 15 years ago    Review of Systems Constitutional: No fever. Positive chills.  ENT: Positive sore throat. Positive congestion.  Cardiovascular: Denies chest pain. Respiratory: Denies shortness of breath. Gastrointestinal: No abdominal pain.  No nausea, no vomiting.  No diarrhea.   Genitourinary: Negative for dysuria. Skin: Negative for rash.   ____________________________________________   PHYSICAL EXAM:  VITAL SIGNS: ED Triage Vitals  Enc Vitals Group     BP 09/18/18 1200 127/73     Pulse Rate 09/18/18 1200 90     Resp 09/18/18 1200 20     Temp 09/18/18 1200 98 F (36.7 C)     Temp Source 09/18/18 1200 Oral     SpO2 09/18/18 1200 97 %     Weight 09/18/18 1201 176 lb (79.8 kg)     Height 09/18/18 1201 5\' 1"  (1.549 m)     Head Circumference --      Peak Flow --      Pain Score 09/18/18 1201 7     Pain Loc --      Pain Edu? --      Excl. in GC? --    Constitutional: Alert and oriented. Well appearing and in no acute distress. Eyes: Conjunctivae are normal. Head: Atraumatic. No sinus tenderness to palpation. No swelling. No erythema.  Ears: no  erythema, normal TMs bilaterally.   Nose:Nasal congestion with clear rhinorrhea  Mouth/Throat: Mucous membranes are moist. Mild pharyngeal erythema. No tonsillar swelling or exudate.  Neck: No stridor.  No cervical spine tenderness to palpation. Hematological/Lymphatic/Immunilogical: No cervical lymphadenopathy. Cardiovascular: Normal rate, regular rhythm. Grossly normal heart sounds.  Good peripheral circulation. Respiratory: Normal respiratory effort.  No retractions.  No rhonchi.  Mild scattered expiratory wheezes.  Good air movement.  Dry intermittent cough. Musculoskeletal: Ambulatory with steady gait.  No lower extremity edema noted. Neurologic:  Normal speech and language. No gait instability. Skin:  Skin appears warm, dry and intact. No rash noted. Psychiatric: Mood and affect are  normal. Speech and behavior are normal. ___________________________________________   LABS (all labs ordered are listed, but only abnormal results are displayed)  Labs Reviewed  RAPID INFLUENZA A&B ANTIGENS ([facility] ONLY)  RAPID STREP SCREEN ([facility] ONLY)  CULTURE, GROUP A STREP [facility])   ____________________________________________    PROCEDURES Procedures    INITIAL IMPRESSION / ASSESSMENT AND PLAN / ED COURSE  Pertinent labs & imaging results that were available during my care of the patient were reviewed by me and considered in my medical decision making (see chart for details).  Overall well-appearing patient.  No acute distress.  Strep negative, will culture.  Suspect influenza-like illness and mild COPD exacerbation.  Discussed with patient.  2 days of symptoms, will treat with Tamiflu, PRN Tessalon Perles and prednisone.  Continue home albuterol treatments as needed.  Tylenol, fluids, rest, supportive care.  Discussed very strict follow-up and return parameters. Discussed indication, risks and benefits of medications with patient.  Discussed follow up with Primary care physician this  week. Discussed follow up and return parameters including no resolution or any worsening concerns. Patient verbalized understanding and agreed to plan.   ____________________________________________   FINAL CLINICAL IMPRESSION(S) / ED DIAGNOSES  Final diagnoses:  Influenza-like illness  COPD exacerbation (HCC)     ED Discharge Orders         Ordered    predniSONE (DELTASONE) 20 MG tablet  Daily     09/18/18 1305    benzonatate (TESSALON) 200 MG capsule  3 times daily PRN     09/18/18 1305    oseltamivir (TAMIFLU) 75 MG capsule  Every 12 hours     09/18/18 1305           Note: This dictation was prepared with Dragon dictation along with smaller phrase technology. Any transcriptional errors that result from this process are unintentional.         Renford Dills, NP 09/18/18 1344

## 2018-09-21 LAB — CULTURE, GROUP A STREP (THRC)

## 2018-10-09 ENCOUNTER — Telehealth: Payer: Self-pay

## 2018-10-09 NOTE — Telephone Encounter (Signed)
Patient has an appointment tomorrow for labs only. However, patient stated that she was taking care of her son and didn't have someone to care for him. Can you please contact patient to reschedule. Thank you.

## 2018-10-10 ENCOUNTER — Inpatient Hospital Stay: Payer: Medicaid Other

## 2018-10-15 ENCOUNTER — Telehealth: Payer: Self-pay

## 2018-10-15 ENCOUNTER — Other Ambulatory Visit: Payer: Self-pay

## 2018-10-15 NOTE — Telephone Encounter (Signed)
  Please put her on the list for call back after the COVID-19 isolation over.  You can call and discuss telephone visits.  M

## 2018-10-15 NOTE — Telephone Encounter (Signed)
Called patient to do her travel screening and she stated that she would like to cancel all of her appointments until all of the COVID-19 is over with. Please cancel her appointments and give her a call if you have further questions or to reschedule. Thank you.

## 2018-10-16 ENCOUNTER — Other Ambulatory Visit: Payer: Medicaid Other

## 2018-10-18 ENCOUNTER — Encounter: Admission: RE | Payer: Self-pay | Source: Home / Self Care

## 2018-10-18 ENCOUNTER — Ambulatory Visit: Admission: RE | Admit: 2018-10-18 | Payer: Medicaid Other | Source: Home / Self Care | Admitting: Gastroenterology

## 2018-10-18 SURGERY — ESOPHAGOGASTRODUODENOSCOPY (EGD) WITH PROPOFOL
Anesthesia: General

## 2018-11-16 ENCOUNTER — Other Ambulatory Visit: Payer: Self-pay

## 2018-11-16 ENCOUNTER — Encounter: Payer: Self-pay | Admitting: Emergency Medicine

## 2018-11-16 DIAGNOSIS — Y939 Activity, unspecified: Secondary | ICD-10-CM | POA: Insufficient documentation

## 2018-11-16 DIAGNOSIS — S4992XA Unspecified injury of left shoulder and upper arm, initial encounter: Secondary | ICD-10-CM | POA: Diagnosis present

## 2018-11-16 DIAGNOSIS — Y929 Unspecified place or not applicable: Secondary | ICD-10-CM | POA: Diagnosis not present

## 2018-11-16 DIAGNOSIS — J449 Chronic obstructive pulmonary disease, unspecified: Secondary | ICD-10-CM | POA: Insufficient documentation

## 2018-11-16 DIAGNOSIS — F17210 Nicotine dependence, cigarettes, uncomplicated: Secondary | ICD-10-CM | POA: Diagnosis not present

## 2018-11-16 DIAGNOSIS — W01190A Fall on same level from slipping, tripping and stumbling with subsequent striking against furniture, initial encounter: Secondary | ICD-10-CM | POA: Diagnosis not present

## 2018-11-16 DIAGNOSIS — S42222A 2-part displaced fracture of surgical neck of left humerus, initial encounter for closed fracture: Secondary | ICD-10-CM | POA: Diagnosis not present

## 2018-11-16 DIAGNOSIS — Z79899 Other long term (current) drug therapy: Secondary | ICD-10-CM | POA: Diagnosis not present

## 2018-11-16 DIAGNOSIS — Y999 Unspecified external cause status: Secondary | ICD-10-CM | POA: Diagnosis not present

## 2018-11-16 NOTE — ED Triage Notes (Signed)
Pt says she was called from another room by her mother; went to get up to go see what she needed when she tripped over a cord; pt says she fell left into a wooden dresser; pain/tenderness and swelling to left upper arm; too painful to move

## 2018-11-17 ENCOUNTER — Emergency Department
Admission: EM | Admit: 2018-11-17 | Discharge: 2018-11-17 | Disposition: A | Discharge status: Home / Self Care | Payer: Medicaid Other | Attending: Student in an Organized Health Care Education/Training Program | Admitting: Student in an Organized Health Care Education/Training Program

## 2018-11-17 ENCOUNTER — Emergency Department: Payer: Medicaid Other

## 2018-11-17 DIAGNOSIS — S42222A 2-part displaced fracture of surgical neck of left humerus, initial encounter for closed fracture: Secondary | ICD-10-CM

## 2018-11-17 HISTORY — DX: Post-traumatic stress disorder, unspecified: F43.10

## 2018-11-17 MED ORDER — HYDROCODONE-ACETAMINOPHEN 5-325 MG PO TABS
1.0000 | ORAL_TABLET | Freq: Once | ORAL | Status: AC
  Administered 2018-11-17: 1 via ORAL
  Filled 2018-11-17: qty 1

## 2018-11-17 MED ORDER — HYDROCODONE-ACETAMINOPHEN 5-325 MG PO TABS: 1.0000 | ORAL_TABLET | ORAL | 0 refills | Status: DC | PRN

## 2018-11-17 MED ORDER — ONDANSETRON HCL 4 MG/2ML IJ SOLN
4.0000 mg | Freq: Once | INTRAMUSCULAR | Status: AC
  Administered 2018-11-17: 4 mg via INTRAVENOUS
  Filled 2018-11-17: qty 2

## 2018-11-17 MED ORDER — MORPHINE SULFATE (PF) 4 MG/ML IV SOLN
4.0000 mg | INTRAVENOUS | Status: DC | PRN
  Administered 2018-11-17: 01:00:00 4 mg via INTRAVENOUS
  Filled 2018-11-17: qty 1

## 2018-11-17 NOTE — ED Notes (Signed)
Pt waiting on mother to pick up.

## 2018-11-17 NOTE — ED Provider Notes (Signed)
[facility] Emergency Department Provider Note    First MD Initiated Contact with Patient 11/17/18 [phone]     (approximate)  I have reviewed the triage vital signs and the nursing notes.   HISTORY  Chief Complaint Arm Pain    HPI Victoria Kent is a 53 y.o. female close past medical history presents the ER for acute left arm pain that occurred this evening after she fell against a dresser.  States that she was getting up out of bed as her mother was calling for help.  She tripped on a cord and fell with left arm extended into dresser.  Denies any head injury.  No hip injury.  Complaining of mild to severe left arm and shoulder pain.  Not on any anticoagulation.  No numbness or tingling.    Past Medical History:  Diagnosis Date  . ADD (attention deficit disorder)   . Anxiety   . COPD (chronic obstructive pulmonary disease) (HCC)   . Drug abuse, opioid type (HCC)   . Fibromyalgia   . Post traumatic stress disorder    Family History  Problem Relation Age of Onset  . Skin cancer Mother        melanoma  . Diabetes Mother   . Other Father        unknown medical history   Past Surgical History:  Procedure Laterality Date  . CESAREAN SECTION     Patient Active Problem List   Diagnosis Date Noted  . Erythrocytosis 08/29/2018  . Thrombocytopenia (HCC) 08/29/2018  . Narcotic addiction (HCC) 01/10/2017  . Fibromyalgia 01/09/2017  . Chronic obstructive pulmonary disease (HCC) 09/06/2016  . Chronic midline thoracic back pain 02/18/2016  . 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 COPD (chronic obstructive pulmonary disease) (HCC) 11/27/2015  . Anxiety 05/29/2014  . 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 ADHD (attention deficit hyperactivity disorder) 04/03/2014  . Insomnia 09/21/2013  . PTSD (post-traumatic stress disorder) 09/21/2013  . MDD (major depressive disorder) 09/21/2013  . Chronic pain 10/11/2009       Prior to Admission medications   Medication Sig Start Date End Date Taking? Authorizing Provider  albuterol (PROVENTIL HFA;VENTOLIN HFA) 108 (90 Base) MCG/ACT inhaler Inhale 2 puffs into the lungs every 6 (six) hours as needed for wheezing. 09/06/16 11/16/18 Yes [provider]  diazepam (VALIUM) 2 MG tablet Take 6-8 mg by mouth 2 (two) times daily. Take 6 mg in the morning and 8 mg at bedtime. 06/17/16  Yes [provider]  FLOVENT HFA 110 MCG/ACT inhaler INHALE 1 INHALATION INTO THE LUNGS 2 (TWO) TIMES DAILY 08/14/18  Yes [provider]  gabapentin (NEURONTIN) 300 MG capsule Take 300 mg by mouth 4 (four) times daily. 07/29/16  Yes [provider]  lisdexamfetamine (VYVANSE) 50 MG capsule Take 50 mg by mouth daily. 09/02/16  Yes [provider]  naloxone (NARCAN) nasal spray 4 mg/0.1 mL Place into the nose. 01/30/18 01/30/20 Yes [provider]  SUBOXONE 8-2 MG FILM Place 1 Film under the tongue 3 (three) times daily. 07/30/16  Yes [provider]  tiotropium (SPIRIVA) 18 MCG inhalation capsule Place 1 capsule into inhaler and inhale daily. 09/06/16 11/16/18 Yes [provider]  HYDROcodone-acetaminophen (NORCO) 5-325 MG tablet Take 1 tablet by mouth every 4 (four) hours as needed for moderate pain. 11/17/18   Willy Eddy, MD    Allergies Patient has no known allergies.    Social History Social History  Tobacco Use  . Smoking status: Current Every Day Smoker    Packs/day: 1.00    Years: 35.00    Pack years: 35.00    Types: Cigarettes  . Smokeless tobacco: Never Used  Substance Use Topics  . Alcohol use: Yes    Comment: social  . Drug use: Not Currently    Types: Cocaine    Comment: last use 15 years ago    Review of Systems Patient denies headaches, rhinorrhea, blurry vision, numbness, shortness of breath, chest pain, edema, cough, abdominal pain, nausea, vomiting, diarrhea, dysuria, fevers, rashes or  hallucinations unless otherwise stated above in HPI. ____________________________________________   PHYSICAL EXAM:  VITAL SIGNS: Vitals:   11/16/18 2352  BP: 139/78  Pulse: 89  Resp: 19  Temp: 97.6 F (36.4 C)  SpO2: 96%    Constitutional: Alert and oriented.  Eyes: Conjunctivae are normal.  Head: Atraumatic. Nose: No congestion/rhinnorhea. Mouth/Throat: Mucous membranes are moist.   Neck: No stridor. Painless ROM.  Cardiovascular: Normal rate, regular rhythm. Grossly normal heart sounds.  Good peripheral circulation. Respiratory: Normal respiratory effort.  No retractions. Lungs CTAB. Gastrointestinal: Soft and nontender. No distention. No abdominal bruits. No CVA tenderness. Genitourinary:  Musculoskeletal: ttp on left shoulder.  N/v intact distally.  No overlying laceration.  No lower extremity tenderness nor edema.  No joint effusions. Neurologic:  Normal speech and language. No gross focal neurologic deficits are appreciated. No facial droop Skin:  Skin is warm, dry and intact. No rash noted. Psychiatric: Mood and affect are normal. Speech and behavior are normal.  ____________________________________________   LABS (all labs ordered are listed, but only abnormal results are displayed)  No results found for this or any previous visit (from the past 24 hour(s)). ____________________________________________  EK____________________________________________  RADIOLOGY  I personally reviewed all radiographic images ordered to evaluate for the above acute complaints and reviewed radiology reports and findings.  These findings were personally discussed with the patient.  Please see medical record for radiology report.  ____________________________________________   PROCEDURES  Procedure(s) performed:  Procedures    Critical Care performed: no ____________________________________________   INITIAL IMPRESSION / ASSESSMENT AND PLAN / ED COURSE  Pertinent labs  & imaging results that were available during my care of the patient were reviewed by me and considered in my medical decision making (see chart for details).   DDX: fracture, contusion, dislocation  Victoria Kent is a 53 y.o. who presents to the ED with symptoms as described above.  Afebrile hemodynamically stable.  Patient with evidence of left proximal humerus fracture.  Neurovascular intact distally.  Will place in sling will give IV pain medication will consult orthopedics  Clinical Course as of Nov 16 328  Sat Nov 17, 2018  0319 Discussed the case with Dr. Roda ShuttersXu agrees with plan for sling and outpatient follow-up.   [PR]    Clinical Course User Index [PR] Willy Eddyobinson, Fujie Dickison, MD    The patient was evaluated in Emergency Department today for the symptoms described in the history of present illness. He/she was evaluated in the context of the global COVID-19 pandemic, which necessitated consideration that the patient might be at risk for infection with the SARS-CoV-2 virus that causes COVID-19. Institutional protocols and algorithms that pertain to the evaluation of patients at risk for COVID-19 are in a state of rapid change based on information released by regulatory bodies including the CDC and federal and state organizations. These policies and algorithms were followed during the patient's care in the ED.  As part of my medical decision making, I reviewed the following data within the electronic MEDICAL RECORD NUMBER Nursing notes reviewed and incorporated, Labs reviewed, notes from prior ED visits and [place] Controlled Substance Database   ____________________________________________   FINAL CLINICAL IMPRESSION(S) / ED DIAGNOSES  Final diagnoses:  Closed 2-part displaced fracture of surgical neck of left humerus, initial encounter      NEW MEDICATIONS STARTED DURING THIS VISIT:  New Prescriptions   HYDROCODONE-ACETAMINOPHEN (NORCO) 5-325 MG TABLET    Take 1 tablet by mouth every 4  (four) hours as needed for moderate pain.     Note:  This document was prepared using Dragon voice recognition software and may include unintentional dictation errors.    Willy Eddy, MD 11/17/18 0330

## 2018-11-28 ENCOUNTER — Ambulatory Visit: Payer: Medicaid Other | Admitting: Hematology and Oncology

## 2018-11-28 ENCOUNTER — Other Ambulatory Visit: Payer: Medicaid Other

## 2018-12-04 ENCOUNTER — Other Ambulatory Visit: Payer: Self-pay | Admitting: *Deleted

## 2018-12-04 MED ORDER — FOLIC ACID 1 MG PO TABS: 1.0000 mg | ORAL_TABLET | Freq: Every day | ORAL | 0 refills | Status: DC

## 2018-12-04 NOTE — Telephone Encounter (Signed)
  Patient on folic acid.  Her level was low at last check.  She was to have a follow-up folic acid level 1 month later.  If still low, we will need to increase the dose.  M

## 2018-12-26 ENCOUNTER — Ambulatory Visit (HOSPITAL_BASED_OUTPATIENT_CLINIC_OR_DEPARTMENT_OTHER): Payer: Medicaid Other | Admitting: Hematology and Oncology

## 2018-12-26 ENCOUNTER — Other Ambulatory Visit: Payer: Self-pay

## 2018-12-26 ENCOUNTER — Inpatient Hospital Stay: Payer: Medicaid Other | Attending: Hematology and Oncology

## 2018-12-26 ENCOUNTER — Telehealth: Payer: Self-pay

## 2018-12-26 VITALS — BP 141/88 | HR 94 | Temp 100.0°F | Resp 20 | Wt 190.6 lb

## 2018-12-26 DIAGNOSIS — D696 Thrombocytopenia, unspecified: Secondary | ICD-10-CM

## 2018-12-26 DIAGNOSIS — D751 Secondary polycythemia: Secondary | ICD-10-CM | POA: Diagnosis not present

## 2018-12-26 DIAGNOSIS — K7460 Unspecified cirrhosis of liver: Secondary | ICD-10-CM

## 2018-12-26 DIAGNOSIS — E538 Deficiency of other specified B group vitamins: Secondary | ICD-10-CM

## 2018-12-26 DIAGNOSIS — R791 Abnormal coagulation profile: Secondary | ICD-10-CM

## 2018-12-26 LAB — CBC WITH DIFFERENTIAL/PLATELET
Abs Immature Granulocytes: 0.01 10*3/uL (ref 0.00–0.07)
Basophils Absolute: 0 10*3/uL (ref 0.0–0.1)
Basophils Relative: 1 %
Eosinophils Absolute: 0 10*3/uL (ref 0.0–0.5)
Eosinophils Relative: 1 %
HCT: 43.3 % (ref 36.0–46.0)
Hemoglobin: 16 g/dL — ABNORMAL HIGH (ref 12.0–15.0)
Immature Granulocytes: 0 %
Lymphocytes Relative: 43 %
Lymphs Abs: 1.9 10*3/uL (ref 0.7–4.0)
MCH: 34.9 pg — ABNORMAL HIGH (ref 26.0–34.0)
MCHC: 37 g/dL — ABNORMAL HIGH (ref 30.0–36.0)
MCV: 94.3 fL (ref 80.0–100.0)
Monocytes Absolute: 0.5 10*3/uL (ref 0.1–1.0)
Monocytes Relative: 11 %
Neutro Abs: 2 10*3/uL (ref 1.7–7.7)
Neutrophils Relative %: 44 %
Platelets: 59 10*3/uL — ABNORMAL LOW (ref 150–400)
RBC: 4.59 MIL/uL (ref 3.87–5.11)
RDW: 12.9 % (ref 11.5–15.5)
WBC: 4.4 10*3/uL (ref 4.0–10.5)
nRBC: 0 % (ref 0.0–0.2)

## 2018-12-26 LAB — FOLATE: Folate: 13.4 ng/mL (ref >5.9)

## 2018-12-26 NOTE — Progress Notes (Signed)
Pt here for follow up. Denies any concerns.  

## 2018-12-26 NOTE — Telephone Encounter (Signed)
Patient encouraged to go to UC per Dr. Kem Parkinson request regarding temperature. Advised to contact facility wit update regarding potential testing, etc. Patient verbalizes understanding.

## 2018-12-27 ENCOUNTER — Other Ambulatory Visit: Payer: Self-pay

## 2018-12-27 ENCOUNTER — Telehealth: Payer: Self-pay

## 2018-12-27 NOTE — Telephone Encounter (Signed)
Attempted to contact patient to see if she was evaluated at UC d/t temperature. Left VM requesting call back.

## 2018-12-27 NOTE — Telephone Encounter (Signed)
-----   Message from Lequita Asal, MD sent at 12/26/2018  5:01 PM EDT ----- Regarding: Please reschedule appt for next week if evaluation is negative.  ----- Message ----- From: Interface, Lab In [place] Sent: 12/26/2018  10:18 AM EDT To: Lequita Asal, MD

## 2018-12-28 LAB — LUPUS ANTICOAGULANT PANEL
DRVVT: 44.9 s (ref 0.0–47.0)
PTT Lupus Anticoagulant: 48.7 s (ref 0.0–51.9)

## 2019-04-26 ENCOUNTER — Other Ambulatory Visit: Payer: Self-pay

## 2019-04-26 DIAGNOSIS — D696 Thrombocytopenia, unspecified: Secondary | ICD-10-CM

## 2019-04-26 NOTE — Progress Notes (Signed)
Called patient for precharting for office visit.  Patient c/o neuropathy in fingers and a spot on her leg that has been hurting for approx 1 week
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
## 2019-04-29 ENCOUNTER — Other Ambulatory Visit: Payer: Self-pay | Admitting: Hematology and Oncology

## 2019-04-29 ENCOUNTER — Encounter: Payer: Self-pay | Admitting: Hematology and Oncology

## 2019-04-29 ENCOUNTER — Other Ambulatory Visit: Payer: Self-pay

## 2019-04-29 ENCOUNTER — Inpatient Hospital Stay: Payer: Medicaid Other | Attending: Hematology and Oncology

## 2019-04-29 ENCOUNTER — Inpatient Hospital Stay (HOSPITAL_BASED_OUTPATIENT_CLINIC_OR_DEPARTMENT_OTHER): Payer: Medicaid Other | Admitting: Hematology and Oncology

## 2019-04-29 VITALS — BP 162/91 | HR 97 | Temp 97.9°F | Resp 16 | Wt 189.0 lb

## 2019-04-29 DIAGNOSIS — K7460 Unspecified cirrhosis of liver: Secondary | ICD-10-CM

## 2019-04-29 DIAGNOSIS — D751 Secondary polycythemia: Secondary | ICD-10-CM | POA: Diagnosis present

## 2019-04-29 DIAGNOSIS — M79651 Pain in right thigh: Secondary | ICD-10-CM

## 2019-04-29 DIAGNOSIS — R161 Splenomegaly, not elsewhere classified: Secondary | ICD-10-CM

## 2019-04-29 DIAGNOSIS — M545 Low back pain, unspecified: Secondary | ICD-10-CM

## 2019-04-29 DIAGNOSIS — J449 Chronic obstructive pulmonary disease, unspecified: Secondary | ICD-10-CM | POA: Insufficient documentation

## 2019-04-29 DIAGNOSIS — E538 Deficiency of other specified B group vitamins: Secondary | ICD-10-CM

## 2019-04-29 DIAGNOSIS — F1010 Alcohol abuse, uncomplicated: Secondary | ICD-10-CM | POA: Diagnosis not present

## 2019-04-29 DIAGNOSIS — R229 Localized swelling, mass and lump, unspecified: Secondary | ICD-10-CM

## 2019-04-29 DIAGNOSIS — D696 Thrombocytopenia, unspecified: Secondary | ICD-10-CM

## 2019-04-29 DIAGNOSIS — F17210 Nicotine dependence, cigarettes, uncomplicated: Secondary | ICD-10-CM | POA: Diagnosis not present

## 2019-04-29 LAB — COMPREHENSIVE METABOLIC PANEL
ALT: 36 U/L (ref 0–44)
AST: 69 U/L — ABNORMAL HIGH (ref 15–41)
Albumin: 3.8 g/dL (ref 3.5–5.0)
Alkaline Phosphatase: 95 U/L (ref 38–126)
Anion gap: 9 (ref 5–15)
BUN: 7 mg/dL (ref 6–20)
CO2: 29 mmol/L (ref 22–32)
Calcium: 8.8 mg/dL — ABNORMAL LOW (ref 8.9–10.3)
Chloride: 95 mmol/L — ABNORMAL LOW (ref 98–111)
Creatinine, Ser: 0.58 mg/dL (ref 0.44–1.00)
GFR calc Af Amer: >60 mL/min (ref >60)
GFR calc non Af Amer: >60 mL/min (ref >60)
Glucose, Bld: 142 mg/dL — ABNORMAL HIGH (ref 70–99)
Potassium: 3.7 mmol/L (ref 3.5–5.1)
Sodium: 133 mmol/L — ABNORMAL LOW (ref 135–145)
Total Bilirubin: 2.1 mg/dL — ABNORMAL HIGH (ref 0.3–1.2)
Total Protein: 8 g/dL (ref 6.5–8.1)

## 2019-04-29 LAB — CBC WITH DIFFERENTIAL/PLATELET
Abs Immature Granulocytes: 0.02 10*3/uL (ref 0.00–0.07)
Basophils Absolute: 0 10*3/uL (ref 0.0–0.1)
Basophils Relative: 1 %
Eosinophils Absolute: 0.1 10*3/uL (ref 0.0–0.5)
Eosinophils Relative: 1 %
HCT: 42 % (ref 36.0–46.0)
Hemoglobin: 15.7 g/dL — ABNORMAL HIGH (ref 12.0–15.0)
Immature Granulocytes: 0 %
Lymphocytes Relative: 51 %
Lymphs Abs: 3 10*3/uL (ref 0.7–4.0)
MCH: 34.1 pg — ABNORMAL HIGH (ref 26.0–34.0)
MCHC: 37.4 g/dL — ABNORMAL HIGH (ref 30.0–36.0)
MCV: 91.3 fL (ref 80.0–100.0)
Monocytes Absolute: 0.4 10*3/uL (ref 0.1–1.0)
Monocytes Relative: 7 %
Neutro Abs: 2.3 10*3/uL (ref 1.7–7.7)
Neutrophils Relative %: 40 %
Platelets: 74 10*3/uL — ABNORMAL LOW (ref 150–400)
RBC: 4.6 MIL/uL (ref 3.87–5.11)
RDW: 12.8 % (ref 11.5–15.5)
WBC: 5.9 10*3/uL (ref 4.0–10.5)
nRBC: 0 % (ref 0.0–0.2)

## 2019-04-29 LAB — VITAMIN B12: Vitamin B-12: 561 pg/mL (ref 180–914)

## 2019-04-29 NOTE — Progress Notes (Signed)
Patient here for follow up. Patient is complaining of bilateral hand numbness x "awhile" and right upper leg aching x 1 week.

## 2019-04-30 LAB — AFP TUMOR MARKER: AFP, Serum, Tumor Marker: 5.8 ng/mL (ref 0.0–8.3)

## 2019-05-01 ENCOUNTER — Ambulatory Visit
Admission: RE | Admit: 2019-05-01 | Discharge: 2019-05-01 | Disposition: A | Discharge status: Home / Self Care | Payer: Medicaid Other | Source: Ambulatory Visit | Attending: Hematology and Oncology | Admitting: Hematology and Oncology

## 2019-05-01 ENCOUNTER — Other Ambulatory Visit: Payer: Self-pay

## 2019-05-01 DIAGNOSIS — M79651 Pain in right thigh: Secondary | ICD-10-CM | POA: Diagnosis present

## 2019-05-01 DIAGNOSIS — M545 Low back pain, unspecified: Secondary | ICD-10-CM

## 2019-05-01 DIAGNOSIS — K7460 Unspecified cirrhosis of liver: Secondary | ICD-10-CM | POA: Diagnosis present

## 2019-05-06 ENCOUNTER — Telehealth: Payer: Self-pay

## 2019-05-06 NOTE — Telephone Encounter (Signed)
Faxed New Patient Referral to Dr. Ines Bloomer. Fax confirmation received.

## 2019-05-10 ENCOUNTER — Telehealth: Payer: Self-pay

## 2019-05-10 ENCOUNTER — Encounter: Payer: Self-pay | Admitting: Hematology and Oncology

## 2019-05-10 NOTE — Telephone Encounter (Signed)
No answer / Unable to leave a message i tried to reach put to the patient to inform her that she will need a f/u visit in person or Doximity visit to discuss the [place] results with Dr Mike Gip.
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
## 2019-05-13 ENCOUNTER — Inpatient Hospital Stay (HOSPITAL_BASED_OUTPATIENT_CLINIC_OR_DEPARTMENT_OTHER): Payer: Medicaid Other | Admitting: Hematology and Oncology

## 2019-05-13 DIAGNOSIS — R161 Splenomegaly, not elsewhere classified: Secondary | ICD-10-CM

## 2019-05-13 DIAGNOSIS — E538 Deficiency of other specified B group vitamins: Secondary | ICD-10-CM | POA: Diagnosis not present

## 2019-05-13 DIAGNOSIS — D696 Thrombocytopenia, unspecified: Secondary | ICD-10-CM

## 2019-05-13 DIAGNOSIS — M79651 Pain in right thigh: Secondary | ICD-10-CM

## 2019-05-13 DIAGNOSIS — G8929 Other chronic pain: Secondary | ICD-10-CM

## 2019-05-13 DIAGNOSIS — M546 Pain in thoracic spine: Secondary | ICD-10-CM

## 2019-05-13 DIAGNOSIS — D751 Secondary polycythemia: Secondary | ICD-10-CM | POA: Diagnosis not present

## 2019-07-26 ENCOUNTER — Other Ambulatory Visit: Payer: Self-pay | Admitting: Hematology and Oncology

## 2019-07-26 ENCOUNTER — Other Ambulatory Visit: Payer: Self-pay

## 2019-07-26 ENCOUNTER — Inpatient Hospital Stay: Payer: Medicaid Other | Attending: Hematology and Oncology

## 2019-07-26 DIAGNOSIS — D751 Secondary polycythemia: Secondary | ICD-10-CM | POA: Insufficient documentation

## 2019-07-26 DIAGNOSIS — Z79899 Other long term (current) drug therapy: Secondary | ICD-10-CM | POA: Insufficient documentation

## 2019-07-26 DIAGNOSIS — F17210 Nicotine dependence, cigarettes, uncomplicated: Secondary | ICD-10-CM | POA: Insufficient documentation

## 2019-07-26 DIAGNOSIS — D696 Thrombocytopenia, unspecified: Secondary | ICD-10-CM | POA: Diagnosis not present

## 2019-07-26 DIAGNOSIS — K7460 Unspecified cirrhosis of liver: Secondary | ICD-10-CM | POA: Diagnosis not present

## 2019-07-26 DIAGNOSIS — E538 Deficiency of other specified B group vitamins: Secondary | ICD-10-CM

## 2019-07-26 DIAGNOSIS — R161 Splenomegaly, not elsewhere classified: Secondary | ICD-10-CM | POA: Insufficient documentation

## 2019-07-26 LAB — CBC WITH DIFFERENTIAL/PLATELET
Abs Immature Granulocytes: 0.01 10*3/uL (ref 0.00–0.07)
Basophils Absolute: 0 10*3/uL (ref 0.0–0.1)
Basophils Relative: 1 %
Eosinophils Absolute: 0.1 10*3/uL (ref 0.0–0.5)
Eosinophils Relative: 1 %
HCT: 42.4 % (ref 36.0–46.0)
Hemoglobin: 15.5 g/dL — ABNORMAL HIGH (ref 12.0–15.0)
Immature Granulocytes: 0 %
Lymphocytes Relative: 45 %
Lymphs Abs: 2.6 10*3/uL (ref 0.7–4.0)
MCH: 34.5 pg — ABNORMAL HIGH (ref 26.0–34.0)
MCHC: 36.6 g/dL — ABNORMAL HIGH (ref 30.0–36.0)
MCV: 94.4 fL (ref 80.0–100.0)
Monocytes Absolute: 0.5 10*3/uL (ref 0.1–1.0)
Monocytes Relative: 8 %
Neutro Abs: 2.7 10*3/uL (ref 1.7–7.7)
Neutrophils Relative %: 45 %
Platelets: 66 10*3/uL — ABNORMAL LOW (ref 150–400)
RBC: 4.49 MIL/uL (ref 3.87–5.11)
RDW: 12.6 % (ref 11.5–15.5)
WBC: 5.9 10*3/uL (ref 4.0–10.5)
nRBC: 0 % (ref 0.0–0.2)

## 2019-07-26 LAB — FOLATE: Folate: 38 ng/mL (ref >5.9)

## 2019-07-26 NOTE — Progress Notes (Signed)
Confirmed Name and DOB. Reports left lower leg edema x 2 weeks but states it seems to be getting better.
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
## 2019-07-29 ENCOUNTER — Inpatient Hospital Stay: Payer: Medicaid Other

## 2019-07-29 ENCOUNTER — Inpatient Hospital Stay (HOSPITAL_BASED_OUTPATIENT_CLINIC_OR_DEPARTMENT_OTHER): Payer: Medicaid Other | Admitting: Hematology and Oncology

## 2019-07-29 DIAGNOSIS — E538 Deficiency of other specified B group vitamins: Secondary | ICD-10-CM | POA: Diagnosis not present

## 2019-07-29 DIAGNOSIS — K7460 Unspecified cirrhosis of liver: Secondary | ICD-10-CM

## 2019-07-29 DIAGNOSIS — R161 Splenomegaly, not elsewhere classified: Secondary | ICD-10-CM

## 2019-07-29 DIAGNOSIS — D751 Secondary polycythemia: Secondary | ICD-10-CM | POA: Diagnosis not present

## 2019-07-29 DIAGNOSIS — D6959 Other secondary thrombocytopenia: Secondary | ICD-10-CM

## 2019-07-29 DIAGNOSIS — K7030 Alcoholic cirrhosis of liver without ascites: Secondary | ICD-10-CM

## 2019-07-29 DIAGNOSIS — F17210 Nicotine dependence, cigarettes, uncomplicated: Secondary | ICD-10-CM | POA: Diagnosis not present

## 2019-07-29 DIAGNOSIS — D696 Thrombocytopenia, unspecified: Secondary | ICD-10-CM

## 2019-07-31 ENCOUNTER — Other Ambulatory Visit: Payer: Self-pay | Admitting: Family Medicine

## 2019-07-31 DIAGNOSIS — Z1231 Encounter for screening mammogram for malignant neoplasm of breast: Secondary | ICD-10-CM

## 2019-08-21 ENCOUNTER — Other Ambulatory Visit: Payer: Self-pay | Admitting: Gastroenterology

## 2019-08-21 DIAGNOSIS — K7030 Alcoholic cirrhosis of liver without ascites: Secondary | ICD-10-CM

## 2019-08-26 ENCOUNTER — Ambulatory Visit
Admission: RE | Admit: 2019-08-26 | Discharge: 2019-08-26 | Disposition: A | Discharge status: Home / Self Care | Payer: Medicaid Other | Source: Ambulatory Visit | Attending: Gastroenterology | Admitting: Gastroenterology

## 2019-08-26 ENCOUNTER — Other Ambulatory Visit: Payer: Self-pay

## 2019-08-26 DIAGNOSIS — K7030 Alcoholic cirrhosis of liver without ascites: Secondary | ICD-10-CM | POA: Diagnosis present

## 2019-09-04 ENCOUNTER — Other Ambulatory Visit: Admission: RE | Admit: 2019-09-04 | Payer: Medicaid Other | Source: Ambulatory Visit

## 2019-09-09 ENCOUNTER — Other Ambulatory Visit: Payer: Self-pay

## 2019-09-10 ENCOUNTER — Other Ambulatory Visit: Payer: Self-pay

## 2019-09-10 ENCOUNTER — Inpatient Hospital Stay: Payer: Medicaid Other | Attending: Hematology and Oncology

## 2019-09-10 DIAGNOSIS — D751 Secondary polycythemia: Secondary | ICD-10-CM | POA: Diagnosis not present

## 2019-09-10 DIAGNOSIS — D696 Thrombocytopenia, unspecified: Secondary | ICD-10-CM

## 2019-09-10 LAB — CBC WITH DIFFERENTIAL/PLATELET
Abs Immature Granulocytes: 0.03 10*3/uL (ref 0.00–0.07)
Basophils Absolute: 0 10*3/uL (ref 0.0–0.1)
Basophils Relative: 1 %
Eosinophils Absolute: 0.1 10*3/uL (ref 0.0–0.5)
Eosinophils Relative: 1 %
HCT: 41.6 % (ref 36.0–46.0)
Hemoglobin: 15.1 g/dL — ABNORMAL HIGH (ref 12.0–15.0)
Immature Granulocytes: 0 %
Lymphocytes Relative: 41 %
Lymphs Abs: 2.8 10*3/uL (ref 0.7–4.0)
MCH: 34.3 pg — ABNORMAL HIGH (ref 26.0–34.0)
MCHC: 36.3 g/dL — ABNORMAL HIGH (ref 30.0–36.0)
MCV: 94.5 fL (ref 80.0–100.0)
Monocytes Absolute: 0.5 10*3/uL (ref 0.1–1.0)
Monocytes Relative: 8 %
Neutro Abs: 3.4 10*3/uL (ref 1.7–7.7)
Neutrophils Relative %: 49 %
Platelets: 77 10*3/uL — ABNORMAL LOW (ref 150–400)
RBC: 4.4 MIL/uL (ref 3.87–5.11)
RDW: 12.6 % (ref 11.5–15.5)
WBC: 6.9 10*3/uL (ref 4.0–10.5)
nRBC: 0 % (ref 0.0–0.2)

## 2019-09-10 LAB — COMPREHENSIVE METABOLIC PANEL
ALT: 39 U/L (ref 0–44)
AST: 79 U/L — ABNORMAL HIGH (ref 15–41)
Albumin: 3.8 g/dL (ref 3.5–5.0)
Alkaline Phosphatase: 96 U/L (ref 38–126)
Anion gap: 9 (ref 5–15)
BUN: 8 mg/dL (ref 6–20)
CO2: 27 mmol/L (ref 22–32)
Calcium: 8.5 mg/dL — ABNORMAL LOW (ref 8.9–10.3)
Chloride: 98 mmol/L (ref 98–111)
Creatinine, Ser: 0.48 mg/dL (ref 0.44–1.00)
GFR calc Af Amer: >60 mL/min (ref >60)
GFR calc non Af Amer: >60 mL/min (ref >60)
Glucose, Bld: 161 mg/dL — ABNORMAL HIGH (ref 70–99)
Potassium: 3.6 mmol/L (ref 3.5–5.1)
Sodium: 134 mmol/L — ABNORMAL LOW (ref 135–145)
Total Bilirubin: 3 mg/dL — ABNORMAL HIGH (ref 0.3–1.2)
Total Protein: 8.1 g/dL (ref 6.5–8.1)

## 2019-09-10 LAB — VITAMIN B12: Vitamin B-12: 729 pg/mL (ref 180–914)

## 2019-09-10 LAB — FOLATE: Folate: 14.7 ng/mL (ref >5.9)

## 2019-09-11 LAB — AFP TUMOR MARKER: AFP, Serum, Tumor Marker: 5 ng/mL (ref 0.0–8.3)

## 2019-10-01 ENCOUNTER — Inpatient Hospital Stay: Admission: RE | Admit: 2019-10-01 | Payer: Medicaid Other | Source: Ambulatory Visit

## 2019-10-02 ENCOUNTER — Other Ambulatory Visit
Admission: RE | Admit: 2019-10-02 | Discharge: 2019-10-02 | Disposition: A | Discharge status: Home / Self Care | Payer: Medicaid Other | Source: Ambulatory Visit | Attending: General Surgery | Admitting: General Surgery

## 2019-10-02 ENCOUNTER — Other Ambulatory Visit: Payer: Self-pay

## 2019-10-02 DIAGNOSIS — Z01812 Encounter for preprocedural laboratory examination: Secondary | ICD-10-CM | POA: Insufficient documentation

## 2019-10-02 DIAGNOSIS — Z20822 Contact with and (suspected) exposure to covid-19: Secondary | ICD-10-CM | POA: Insufficient documentation

## 2019-10-02 LAB — SARS CORONAVIRUS 2 (TAT 6-24 HRS): SARS Coronavirus 2: NEGATIVE

## 2019-10-04 ENCOUNTER — Ambulatory Visit: Payer: Medicaid Other | Admitting: Anesthesiology

## 2019-10-04 ENCOUNTER — Other Ambulatory Visit: Payer: Self-pay

## 2019-10-04 ENCOUNTER — Ambulatory Visit
Admission: RE | Admit: 2019-10-04 | Discharge: 2019-10-04 | Disposition: A | Discharge status: Home / Self Care | Payer: Medicaid Other | Attending: General Surgery | Admitting: General Surgery

## 2019-10-04 ENCOUNTER — Encounter
Admission: RE | Disposition: A | Discharge status: Home / Self Care | Payer: Self-pay | Source: Home / Self Care | Attending: General Surgery

## 2019-10-04 ENCOUNTER — Encounter: Payer: Self-pay | Admitting: General Surgery

## 2019-10-04 DIAGNOSIS — K7460 Unspecified cirrhosis of liver: Secondary | ICD-10-CM | POA: Diagnosis present

## 2019-10-04 DIAGNOSIS — F17210 Nicotine dependence, cigarettes, uncomplicated: Secondary | ICD-10-CM | POA: Diagnosis not present

## 2019-10-04 DIAGNOSIS — K2951 Unspecified chronic gastritis with bleeding: Secondary | ICD-10-CM | POA: Insufficient documentation

## 2019-10-04 DIAGNOSIS — Z79899 Other long term (current) drug therapy: Secondary | ICD-10-CM | POA: Diagnosis not present

## 2019-10-04 DIAGNOSIS — Z79891 Long term (current) use of opiate analgesic: Secondary | ICD-10-CM | POA: Diagnosis not present

## 2019-10-04 DIAGNOSIS — F988 Other specified behavioral and emotional disorders with onset usually occurring in childhood and adolescence: Secondary | ICD-10-CM | POA: Insufficient documentation

## 2019-10-04 DIAGNOSIS — R933 Abnormal findings on diagnostic imaging of other parts of digestive tract: Secondary | ICD-10-CM | POA: Insufficient documentation

## 2019-10-04 DIAGNOSIS — F419 Anxiety disorder, unspecified: Secondary | ICD-10-CM | POA: Insufficient documentation

## 2019-10-04 DIAGNOSIS — J449 Chronic obstructive pulmonary disease, unspecified: Secondary | ICD-10-CM | POA: Insufficient documentation

## 2019-10-04 DIAGNOSIS — Z7951 Long term (current) use of inhaled steroids: Secondary | ICD-10-CM | POA: Diagnosis not present

## 2019-10-04 DIAGNOSIS — M797 Fibromyalgia: Secondary | ICD-10-CM | POA: Diagnosis not present

## 2019-10-04 HISTORY — PX: ESOPHAGOGASTRODUODENOSCOPY (EGD) WITH PROPOFOL: SHX5813

## 2019-10-04 LAB — URINE DRUG SCREEN, QUALITATIVE (ARMC ONLY)
Amphetamines, Ur Screen: POSITIVE — AB
Barbiturates, Ur Screen: NOT DETECTED
Benzodiazepine, Ur Scrn: POSITIVE — AB
Cannabinoid 50 Ng, Ur ~~LOC~~: NOT DETECTED
Cocaine Metabolite,Ur ~~LOC~~: NOT DETECTED
MDMA (Ecstasy)Ur Screen: NOT DETECTED
Methadone Scn, Ur: NOT DETECTED
Opiate, Ur Screen: NOT DETECTED
Phencyclidine (PCP) Ur S: NOT DETECTED
Tricyclic, Ur Screen: NOT DETECTED

## 2019-10-04 SURGERY — ESOPHAGOGASTRODUODENOSCOPY (EGD) WITH PROPOFOL
Anesthesia: General

## 2019-10-04 MED ORDER — SODIUM CHLORIDE 0.9 % IV SOLN
INTRAVENOUS | Status: DC
  Administered 2019-10-04: 1000 mL via INTRAVENOUS

## 2019-10-04 MED ORDER — PROPOFOL 500 MG/50ML IV EMUL
INTRAVENOUS | Status: AC
  Filled 2019-10-04: qty 50

## 2019-10-04 MED ORDER — GLYCOPYRROLATE 0.2 MG/ML IJ SOLN
INTRAMUSCULAR | Status: DC | PRN
  Administered 2019-10-04: .2 mg via INTRAVENOUS

## 2019-10-04 MED ORDER — MIDAZOLAM HCL 2 MG/2ML IJ SOLN
INTRAMUSCULAR | Status: AC
  Filled 2019-10-04: qty 2

## 2019-10-04 MED ORDER — PROPOFOL 500 MG/50ML IV EMUL
INTRAVENOUS | Status: DC | PRN
  Administered 2019-10-04: 175 ug/kg/min via INTRAVENOUS

## 2019-10-04 MED ORDER — PROPOFOL 10 MG/ML IV BOLUS
INTRAVENOUS | Status: DC | PRN
  Administered 2019-10-04: 80 mg via INTRAVENOUS
  Administered 2019-10-04: 20 mg via INTRAVENOUS

## 2019-10-04 MED ORDER — MIDAZOLAM HCL 2 MG/2ML IJ SOLN
INTRAMUSCULAR | Status: DC | PRN
  Administered 2019-10-04: 2 mg via INTRAVENOUS

## 2019-10-04 MED ORDER — LIDOCAINE HCL (CARDIAC) PF 100 MG/5ML IV SOSY
PREFILLED_SYRINGE | INTRAVENOUS | Status: DC | PRN
  Administered 2019-10-04 (×2): 40 mg via INTRAVENOUS

## 2019-10-04 NOTE — Anesthesia Procedure Notes (Signed)
Date/Time: 10/04/2019 8:35 AM Performed by: Stormy Fabian, CRNA Pre-anesthesia Checklist: Patient identified, Emergency Drugs available, Suction available and Patient being monitored Patient Re-evaluated:Patient Re-evaluated prior to induction Oxygen Delivery Method: Nasal cannula Induction Type: IV induction Dental Injury: Teeth and Oropharynx as per pre-operative assessment  Comments: Nasal cannula with etCO2 monitoring

## 2019-10-04 NOTE — Op Note (Addendum)
[facility]astroenterology Patient Name: Victoria Kent Procedure Date: 10/04/2019 6:54 AM MRN: [id number] Account #: [id number] Date of Birth: 1966/04/19 Admit Type: Outpatient Age: 54 Room: [facility] ENDO ROOM 1 Gender: Female Note Status: Finalized Procedure:             Upper GI endoscopy Indications:           Abnormal ultrasound of the GI tract Providers:             Earline Mayotte, MD Referring MD:          Courtney Paris. Cliffton Asters, MD (Referring MD) Medicines:             Monitored Anesthesia Care Complications:         No immediate complications. Procedure:             Pre-Anesthesia Assessment:                        - Prior to the procedure, a History and Physical was                         performed, and patient medications, allergies and                         sensitivities were reviewed. The patient's tolerance                         of previous anesthesia was reviewed.                        - The risks and benefits of the procedure and the                         sedation options and risks were discussed with the                         patient. All questions were answered and informed                         consent was obtained.                        After obtaining informed consent, the endoscope was                         passed under direct vision. Throughout the procedure,                         the patient's blood pressure, pulse, and oxygen                         saturations were monitored continuously. The Endoscope                         was introduced through the mouth, and advanced to the                         fourth part of duodenum. The Endoscope was introduced  through the and advanced to the. The upper GI                         endoscopy was accomplished without difficulty. The                         patient tolerated the procedure well. Findings:      The esophagus was normal.      The examined duodenum  was normal.      Diffuse moderate inflammation with hemorrhage characterized by       congestion (edema), friability and linear erosions was found in the       entire examined stomach. Impression:            - Normal esophagus.                        - Normal examined duodenum.                        - Chronic bile gastritis with hemorrhage.                        - No specimens collected. Recommendation:        - Return to GI clinic in 2 weeks. Procedure Code(s):     --- Professional ---                        [phone], Esophagogastroduodenoscopy, flexible,                         transoral; diagnostic, including collection of                         specimen(s) by brushing or washing, when performed                         (separate procedure) Diagnosis Code(s):     --- Professional ---                        R93.3, Abnormal findings on diagnostic imaging of                         other parts of digestive tract                        K29.61, Other gastritis with bleeding                        K29.51, Unspecified chronic gastritis with bleeding CPT copyright 2019 American Medical Association. All rights reserved. The codes documented in this report are preliminary and upon coder review may  be revised to meet current compliance requirements. Robert Bellow, MD 10/04/2019 8:48:30 AM This report has been signed electronically. Number of Addenda: 0 Note Initiated On: 10/04/2019 6:54 AM Estimated Blood Loss:  Estimated blood loss: none.      [facility]

## 2019-10-04 NOTE — H&P (Signed)
Victoria Kent [id number] [birth year]     HPI:  54 year old woman with a history of cirrhosis.  No history of hematemesis. EGD requested to assess for varices.   Medications Prior to Admission  Medication Sig Dispense Refill Last Dose  . diazepam (VALIUM) 2 MG tablet Take 6-8 mg by mouth 2 (two) times daily. Take 6 mg in the morning and 8 mg at bedtime.  0 10/04/2019 at 0600  . FLOVENT HFA 110 MCG/ACT inhaler INHALE 1 INHALATION INTO THE LUNGS 2 (TWO) TIMES DAILY   10/03/2019 at Unknown time  . folic acid (FOLVITE) 1 MG tablet TAKE 1 TABLET BY MOUTH EVERY DAY 90 tablet 0 10/03/2019 at Unknown time  . gabapentin (NEURONTIN) 300 MG capsule Take 300 mg by mouth 4 (four) times daily.  99 10/03/2019 at Unknown time  . lisdexamfetamine (VYVANSE) 50 MG capsule Take 50 mg by mouth daily.   10/03/2019 at Unknown time  . naloxone (NARCAN) nasal spray 4 mg/0.1 mL Place into the nose.   10/03/2019 at Unknown time  . SUBOXONE 8-2 MG FILM Place 1 Film under the tongue 3 (three) times daily.  1 10/03/2019 at Unknown time  . albuterol (PROVENTIL HFA;VENTOLIN HFA) 108 (90 Base) MCG/ACT inhaler Inhale 2 puffs into the lungs every 6 (six) hours as needed for wheezing.     . tiotropium (SPIRIVA) 18 MCG inhalation capsule Place 1 capsule into inhaler and inhale daily.      No Known Allergies Past Medical History:  Diagnosis Date  . ADD (attention deficit disorder)   . Anxiety   . COPD (chronic obstructive pulmonary disease) ([place])   . Drug abuse, opioid type ([place])   . Fibromyalgia   . Post traumatic stress disorder    Past Surgical History:  Procedure Laterality Date  . CESAREAN SECTION     Social History   Socioeconomic History  . Marital status: Married    Spouse name: Not on file  . Number of children: Not on file  . Years of education: Not on file  . Highest education level: Not on file  Occupational History  . Not on file  Tobacco Use  . Smoking status: Current Every Day Smoker    Packs/day:  1.00    Years: 35.00    Pack years: 35.00    Types: Cigarettes  . Smokeless tobacco: Never Used  Substance and Sexual Activity  . Alcohol use: Yes    Comment: social  . Drug use: Not Currently    Types: Cocaine    Comment: last use 15 years ago  . Sexual activity: Not on file  Other Topics Concern  . Not on file  Social History Narrative  . Not on file   Social Determinants of Health   Financial Resource Strain:   . Difficulty of Paying Living Expenses:   Food Insecurity:   . Worried About Charity fundraiser in the Last Year:   . Arboriculturist in the Last Year:   Transportation Needs:   . Film/video editor (Medical):   Marland Kitchen Lack of Transportation (Non-Medical):   Physical Activity:   . Days of Exercise per Week:   . Minutes of Exercise per Session:   Stress:   . Feeling of Stress :   Social Connections:   . Frequency of Communication with Friends and Family:   . Frequency of Social Gatherings with Friends and Family:   . Attends Religious Services:   . Active Member of Clubs  or Organizations:   . Attends Banker Meetings:   Marland Kitchen Marital Status:   Intimate Partner Violence:   . Fear of Current or Ex-Partner:   . Emotionally Abused:   Marland Kitchen Physically Abused:   . Sexually Abused:    Social History   Social History Narrative  . Not on file     ROS: Negative.     PE: HEENT: Negative. Lungs: Clear. Cardio: RR.  Assessment/Plan:  Proceed with planned upper endoscopy to assess for varices.   [facility] 10/04/2019

## 2019-10-04 NOTE — Anesthesia Preprocedure Evaluation (Signed)
Anesthesia Evaluation  Patient identified by MRN, date of birth, ID band Patient awake    Reviewed: Allergy & Precautions, NPO status , Patient's Chart, lab work & pertinent test results  Airway Mallampati: III  TM Distance: >3 FB     Dental   Pulmonary COPD,  COPD inhaler, Current Smoker and Patient abstained from smoking.,    Pulmonary exam normal        Cardiovascular negative cardio ROS Normal cardiovascular exam     Neuro/Psych PSYCHIATRIC DISORDERS Anxiety Depression negative neurological ROS     GI/Hepatic (+) Cirrhosis     substance abuse  ,   Endo/Other  negative endocrine ROS  Renal/GU negative Renal ROS  negative genitourinary   Musculoskeletal  (+) Arthritis , Fibromyalgia -, narcotic dependent  Abdominal Normal abdominal exam  (+)   Peds negative pediatric ROS (+)  Hematology negative hematology ROS (+)   Anesthesia Other Findings   Reproductive/Obstetrics                             Anesthesia Physical Anesthesia Plan  ASA: III  Anesthesia Plan: General   Post-op Pain Management:    Induction: Intravenous  PONV Risk Score and Plan:   Airway Management Planned: Nasal Cannula  Additional Equipment:   Intra-op Plan:   Post-operative Plan:   Informed Consent: I have reviewed the patients History and Physical, chart, labs and discussed the procedure including the risks, benefits and alternatives for the proposed anesthesia with the patient or authorized representative who has indicated his/her understanding and acceptance.     Dental advisory given  Plan Discussed with: CRNA and Surgeon  Anesthesia Plan Comments:         Anesthesia Quick Evaluation

## 2019-10-04 NOTE — Transfer of Care (Signed)
Immediate Anesthesia Transfer of Care Note  Patient: Victoria Kent  Procedure(s) Performed: Procedure(s): ESOPHAGOGASTRODUODENOSCOPY (EGD) WITH PROPOFOL (N/A)  Patient Location: PACU and Endoscopy Unit  Anesthesia Type:General  Level of Consciousness: sedated  Airway & Oxygen Therapy: Patient Spontanous Breathing and Patient connected to nasal cannula oxygen  Post-op Assessment: Report given to RN and Post -op Vital signs reviewed and stable  Post vital signs: Reviewed and stable  Last Vitals:  Vitals:   10/04/19 0700 10/04/19 0853  BP: 131/80 (!) 103/56  Pulse: 89 92  Resp: 20 19  Temp: (!) 36.3 C 36.4 C  SpO2: 97% 96%    Complications: No apparent anesthesia complications

## 2019-10-04 NOTE — Anesthesia Postprocedure Evaluation (Signed)
Anesthesia Post Note  Patient: Victoria Kent  Procedure(s) Performed: ESOPHAGOGASTRODUODENOSCOPY (EGD) WITH PROPOFOL (N/A )  Patient location during evaluation: Endoscopy Anesthesia Type: General Level of consciousness: awake and alert and oriented Pain management: pain level controlled Vital Signs Assessment: post-procedure vital signs reviewed and stable Respiratory status: spontaneous breathing Cardiovascular status: blood pressure returned to baseline Anesthetic complications: no     Last Vitals:  Vitals:   10/04/19 0912 10/04/19 0922  BP: (!) 129/92 126/83  Pulse: 87 84  Resp: (!) 21 19  Temp:    SpO2: 96% 96%    Last Pain:  Vitals:   10/04/19 0912  TempSrc:   PainSc: 0-No pain                 Kiko Ripp

## 2019-10-07 ENCOUNTER — Encounter: Payer: Self-pay | Admitting: *Deleted

## 2019-10-16 ENCOUNTER — Other Ambulatory Visit: Payer: Self-pay | Admitting: Gastroenterology

## 2019-10-16 DIAGNOSIS — K7030 Alcoholic cirrhosis of liver without ascites: Secondary | ICD-10-CM

## 2019-10-22 ENCOUNTER — Ambulatory Visit: Payer: Medicaid Other

## 2019-10-29 ENCOUNTER — Other Ambulatory Visit: Payer: Self-pay

## 2019-10-29 DIAGNOSIS — E538 Deficiency of other specified B group vitamins: Secondary | ICD-10-CM

## 2019-10-29 DIAGNOSIS — K7460 Unspecified cirrhosis of liver: Secondary | ICD-10-CM

## 2019-10-30 ENCOUNTER — Other Ambulatory Visit: Payer: Medicaid Other

## 2019-10-31 ENCOUNTER — Inpatient Hospital Stay: Payer: Medicaid Other

## 2019-10-31 ENCOUNTER — Inpatient Hospital Stay: Payer: Medicaid Other | Attending: Hematology and Oncology | Admitting: Hematology and Oncology

## 2019-12-06 ENCOUNTER — Ambulatory Visit: Payer: Medicaid Other
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
## 2019-12-09 ENCOUNTER — Inpatient Hospital Stay: Payer: Medicaid Other | Attending: Hematology and Oncology

## 2019-12-09 ENCOUNTER — Other Ambulatory Visit: Payer: Self-pay

## 2019-12-09 ENCOUNTER — Telehealth: Payer: Self-pay

## 2019-12-09 ENCOUNTER — Encounter: Payer: Self-pay | Admitting: Hematology and Oncology

## 2019-12-09 ENCOUNTER — Inpatient Hospital Stay (HOSPITAL_BASED_OUTPATIENT_CLINIC_OR_DEPARTMENT_OTHER): Payer: Medicaid Other | Admitting: Hematology and Oncology

## 2019-12-09 ENCOUNTER — Other Ambulatory Visit: Payer: Self-pay | Admitting: Gastroenterology

## 2019-12-09 VITALS — BP 144/74 | HR 110 | Temp 97.4°F | Resp 16 | Wt 185.5 lb

## 2019-12-09 DIAGNOSIS — F17210 Nicotine dependence, cigarettes, uncomplicated: Secondary | ICD-10-CM | POA: Diagnosis not present

## 2019-12-09 DIAGNOSIS — D696 Thrombocytopenia, unspecified: Secondary | ICD-10-CM | POA: Insufficient documentation

## 2019-12-09 DIAGNOSIS — E538 Deficiency of other specified B group vitamins: Secondary | ICD-10-CM

## 2019-12-09 DIAGNOSIS — K7030 Alcoholic cirrhosis of liver without ascites: Secondary | ICD-10-CM

## 2019-12-09 DIAGNOSIS — R161 Splenomegaly, not elsewhere classified: Secondary | ICD-10-CM | POA: Diagnosis not present

## 2019-12-09 DIAGNOSIS — K7460 Unspecified cirrhosis of liver: Secondary | ICD-10-CM

## 2019-12-09 DIAGNOSIS — D751 Secondary polycythemia: Secondary | ICD-10-CM | POA: Diagnosis not present

## 2019-12-09 LAB — CBC WITH DIFFERENTIAL/PLATELET
Abs Immature Granulocytes: 0.02 10*3/uL (ref 0.00–0.07)
Basophils Absolute: 0 10*3/uL (ref 0.0–0.1)
Basophils Relative: 1 %
Eosinophils Absolute: 0.1 10*3/uL (ref 0.0–0.5)
Eosinophils Relative: 1 %
HCT: 43.9 % (ref 36.0–46.0)
Hemoglobin: 16.1 g/dL — ABNORMAL HIGH (ref 12.0–15.0)
Immature Granulocytes: 0 %
Lymphocytes Relative: 35 %
Lymphs Abs: 3 10*3/uL (ref 0.7–4.0)
MCH: 34 pg (ref 26.0–34.0)
MCHC: 36.7 g/dL — ABNORMAL HIGH (ref 30.0–36.0)
MCV: 92.8 fL (ref 80.0–100.0)
Monocytes Absolute: 0.7 10*3/uL (ref 0.1–1.0)
Monocytes Relative: 9 %
Neutro Abs: 4.6 10*3/uL (ref 1.7–7.7)
Neutrophils Relative %: 54 %
Platelets: 61 10*3/uL — ABNORMAL LOW (ref 150–400)
RBC: 4.73 MIL/uL (ref 3.87–5.11)
RDW: 12.6 % (ref 11.5–15.5)
WBC: 8.4 10*3/uL (ref 4.0–10.5)
nRBC: 0 % (ref 0.0–0.2)

## 2019-12-09 LAB — COMPREHENSIVE METABOLIC PANEL
ALT: 43 U/L (ref 0–44)
AST: 103 U/L — ABNORMAL HIGH (ref 15–41)
Albumin: 3.4 g/dL — ABNORMAL LOW (ref 3.5–5.0)
Alkaline Phosphatase: 118 U/L (ref 38–126)
Anion gap: 11 (ref 5–15)
BUN: <5 mg/dL — ABNORMAL LOW (ref 6–20)
CO2: 32 mmol/L (ref 22–32)
Calcium: 8 mg/dL — ABNORMAL LOW (ref 8.9–10.3)
Chloride: 93 mmol/L — ABNORMAL LOW (ref 98–111)
Creatinine, Ser: 0.52 mg/dL (ref 0.44–1.00)
GFR calc Af Amer: >60 mL/min (ref >60)
GFR calc non Af Amer: >60 mL/min (ref >60)
Glucose, Bld: 152 mg/dL — ABNORMAL HIGH (ref 70–99)
Potassium: 2.8 mmol/L — ABNORMAL LOW (ref 3.5–5.1)
Sodium: 136 mmol/L (ref 135–145)
Total Bilirubin: 4.1 mg/dL — ABNORMAL HIGH (ref 0.3–1.2)
Total Protein: 8.2 g/dL — ABNORMAL HIGH (ref 6.5–8.1)

## 2019-12-09 LAB — FOLATE: Folate: 18.9 ng/mL (ref >5.9)

## 2019-12-09 LAB — VITAMIN B12: Vitamin B-12: 1128 pg/mL — ABNORMAL HIGH (ref 180–914)

## 2019-12-09 NOTE — Telephone Encounter (Signed)
Labs forwarded to PCP and GI Kathryne Hitch) to review Hypokalemia in addition to taking diuretics.

## 2019-12-09 NOTE — Telephone Encounter (Signed)
Attempted to contact patient regarding Potassium level. VM left requesting callback.

## 2019-12-09 NOTE — Progress Notes (Signed)
Patient here for follow up. Denies any concerns.  

## 2019-12-09 NOTE — Telephone Encounter (Signed)
-----   Message from Rosey Bath, MD sent at 12/09/2019  4:45 PM EDT ----- Regarding: Please call patient and PCP.  Send labs to PCP and GI Kathryne Hitch).  Potassium is low.  Is she taking any supplemental potassium?  She is on Lasix and HCTZ.  She needs to start potassium tonight.   Rosey Bath, MD   ----- Message ----- From: Leory Plowman, Lab In [place] Sent: 12/09/2019   3:33 PM EDT To: Rosey Bath, MD

## 2019-12-10 ENCOUNTER — Telehealth: Payer: Self-pay

## 2019-12-10 LAB — AFP TUMOR MARKER: AFP, Serum, Tumor Marker: 4.9 ng/mL (ref 0.0–8.3)

## 2019-12-10 NOTE — Telephone Encounter (Signed)
Left message for patient to return my call regarding her recent lab work

## 2019-12-11 ENCOUNTER — Telehealth: Payer: Self-pay

## 2019-12-11 NOTE — Telephone Encounter (Signed)
Left message for patient to call me back regarding lab work 

## 2019-12-13 ENCOUNTER — Ambulatory Visit: Payer: Medicaid Other

## 2019-12-17 ENCOUNTER — Ambulatory Visit
Admission: RE | Admit: 2019-12-17 | Discharge: 2019-12-17 | Disposition: A | Discharge status: Home / Self Care | Payer: Medicaid Other | Source: Ambulatory Visit | Attending: Gastroenterology | Admitting: Gastroenterology

## 2019-12-17 ENCOUNTER — Other Ambulatory Visit: Payer: Self-pay

## 2019-12-17 DIAGNOSIS — K7030 Alcoholic cirrhosis of liver without ascites: Secondary | ICD-10-CM | POA: Diagnosis present

## 2019-12-24 ENCOUNTER — Telehealth: Payer: Self-pay | Admitting: *Deleted

## 2019-12-24 NOTE — Telephone Encounter (Signed)
Patient called asking to speak with Dr Merlene Pulling regarding a letter she asked her to write. [phone]

## 2019-12-25 NOTE — Telephone Encounter (Signed)
  Can you find out who in GI wrote her the letter so I can discuss with them?  Thanks,  M

## 2019-12-25 NOTE — Telephone Encounter (Signed)
  Please call patient about letter.  I think that this was something GI was going to write for her.  M

## 2019-12-26 NOTE — Telephone Encounter (Signed)
Dr. Merlene Pulling - it was Vevelyn Pat, at [facility] that wrote her the letter. I went ahead and included her on this message.   Thanks, Maralyn Sago

## 2020-01-01 ENCOUNTER — Telehealth: Payer: Self-pay | Admitting: *Deleted

## 2020-01-01 NOTE — Telephone Encounter (Signed)
Patient calling asking about letter again. She said her lawyer said it is important that he get this ASP as time for the claim is fast approaching. She requests a return call when this is ready

## 2020-01-01 NOTE — Telephone Encounter (Signed)
  Please contact Vevelyn Pat, NP to call me about this letter.  M

## 2020-01-06 ENCOUNTER — Telehealth: Payer: Self-pay | Admitting: *Deleted

## 2020-01-06 NOTE — Telephone Encounter (Signed)
Patient calling again regarding the letter she needs from Dr Merlene Pulling

## 2020-01-06 NOTE — Telephone Encounter (Signed)
  I will need to talk to GI and see the letter that is being written for her from them.  M

## 2020-01-07 ENCOUNTER — Encounter: Payer: Self-pay | Admitting: *Deleted

## 2020-03-10 ENCOUNTER — Inpatient Hospital Stay: Payer: Medicaid Other | Attending: Hematology and Oncology

## 2020-04-06 ENCOUNTER — Other Ambulatory Visit: Payer: Self-pay

## 2020-04-06 ENCOUNTER — Ambulatory Visit
Admission: EM | Admit: 2020-04-06 | Discharge: 2020-04-06 | Disposition: A | Discharge status: Home / Self Care | Payer: Medicaid Other | Attending: Family Medicine | Admitting: Family Medicine

## 2020-04-06 DIAGNOSIS — Z1152 Encounter for screening for COVID-19: Secondary | ICD-10-CM | POA: Diagnosis not present

## 2020-04-06 DIAGNOSIS — Z0189 Encounter for other specified special examinations: Secondary | ICD-10-CM | POA: Insufficient documentation

## 2020-04-06 DIAGNOSIS — Z20822 Contact with and (suspected) exposure to covid-19: Secondary | ICD-10-CM | POA: Insufficient documentation

## 2020-04-06 NOTE — ED Triage Notes (Signed)
Patient is here for Covid testing.States that her son was sent home from school since he was sitting next to someone who was positive. Patient currently without any symptoms.

## 2020-04-06 NOTE — Discharge Instructions (Signed)
Please continue to keep social distance of 6 feet from others, wash hands frequently and wear facemask indoors or outdoors if you are unable to social distance.  If your Covid test is positive, member of the urgent care team will reach out to you with further instructions. If you have any further questions, please don't hesitate to reach out to the urgent care clinic.  Please sign up for MyChart to access your lab results. c

## 2020-04-07 LAB — SARS CORONAVIRUS 2 (TAT 6-24 HRS): SARS Coronavirus 2: NEGATIVE

## 2020-06-12 ENCOUNTER — Ambulatory Visit: Payer: Medicaid Other | Admitting: Hematology and Oncology

## 2020-06-12 ENCOUNTER — Other Ambulatory Visit: Payer: Medicaid Other

## 2020-06-16 ENCOUNTER — Inpatient Hospital Stay: Payer: Medicaid Other | Admitting: Hematology and Oncology

## 2020-06-16 ENCOUNTER — Other Ambulatory Visit: Payer: Self-pay

## 2020-06-16 ENCOUNTER — Inpatient Hospital Stay: Payer: Medicaid Other | Attending: Hematology and Oncology

## 2020-06-16 DIAGNOSIS — E538 Deficiency of other specified B group vitamins: Secondary | ICD-10-CM

## 2020-08-11 IMAGING — US US ABDOMEN LIMITED
1 series · 12 of 12 positions shown · non-contrast
Comparison: Abdominal ultrasound dated 05/01/2019.

CLINICAL DATA: 53-year-old female with cirrhosis. Evaluate for
ascites.

EXAM:
LIMITED ABDOMEN ULTRASOUND FOR ASCITES
TECHNIQUE: Limited ultrasound survey for ascites was performed in all four
abdominal quadrants.

[Series 1: us abdomen limited · 0.28mm/px · 12 of 12 slices shown]
[im 1/12]
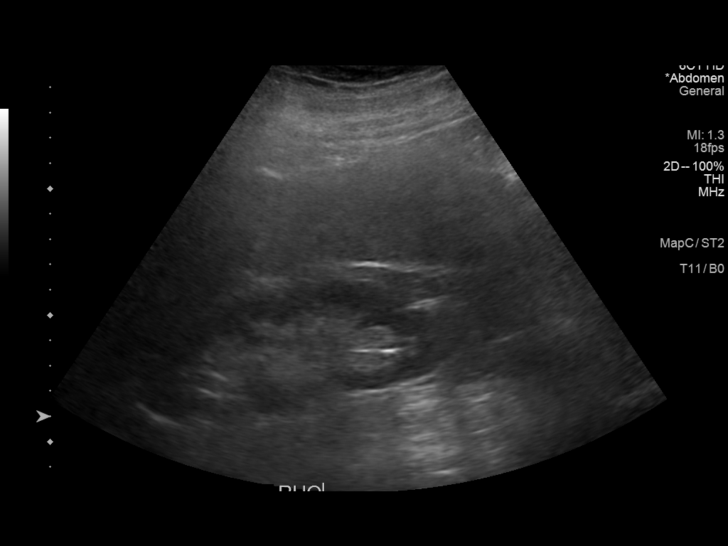
[im 2/12]
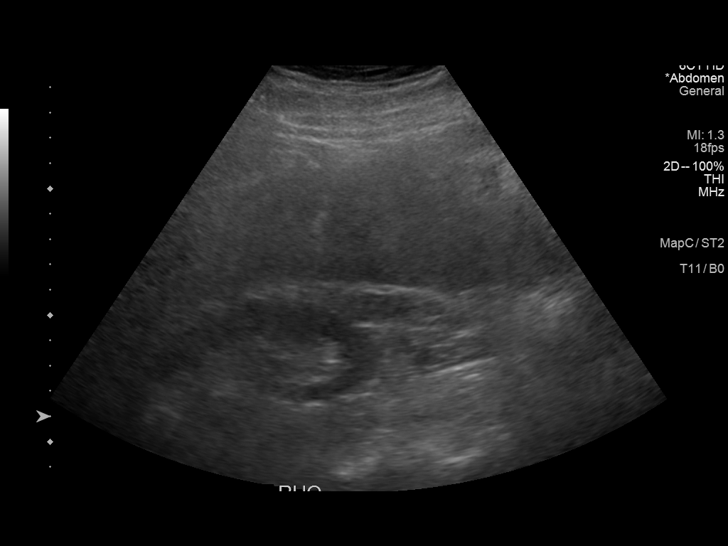
[im 3/12]
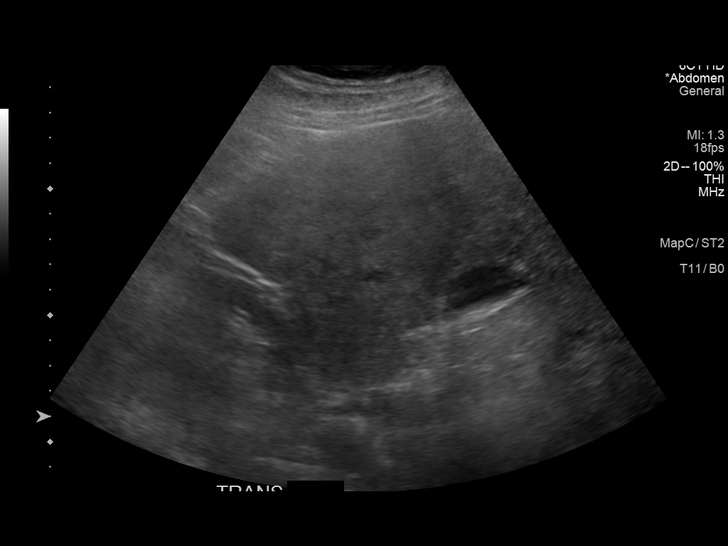
[im 4/12]
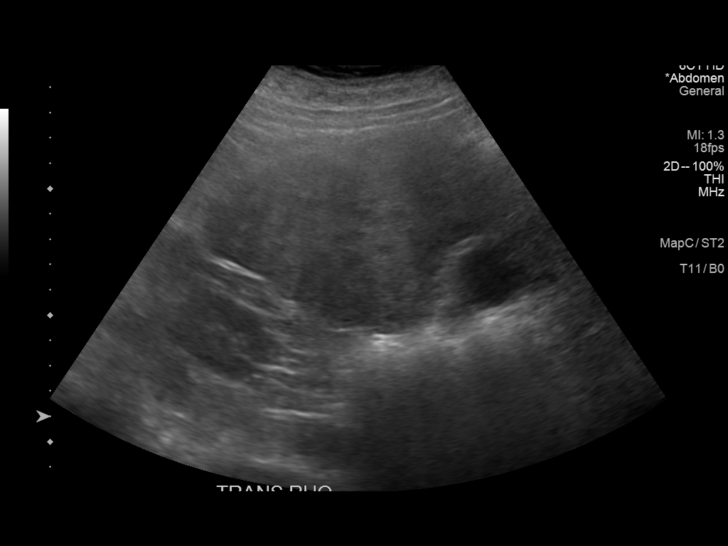
[im 5/12]
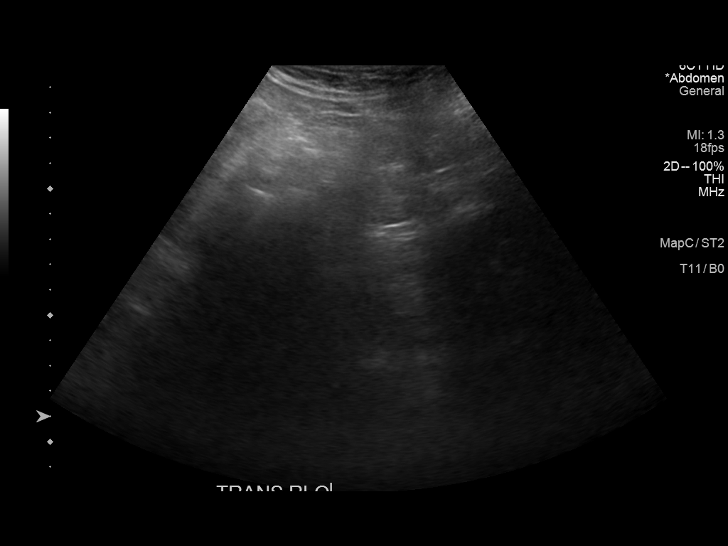
[im 6/12]
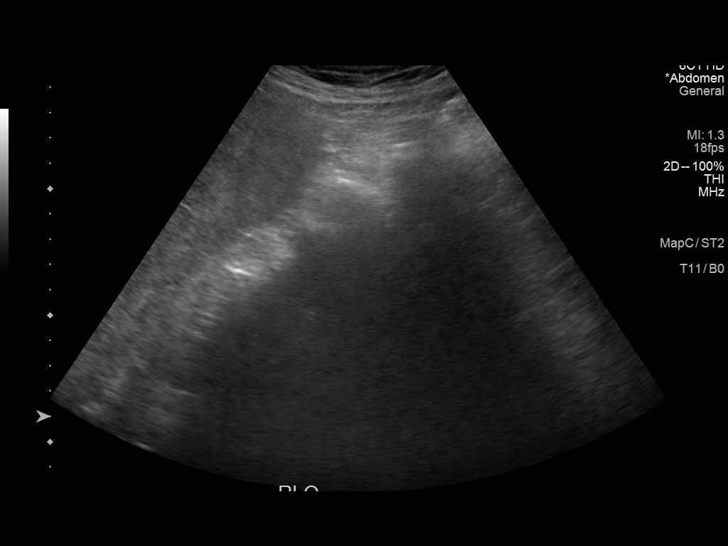
[im 7/12]
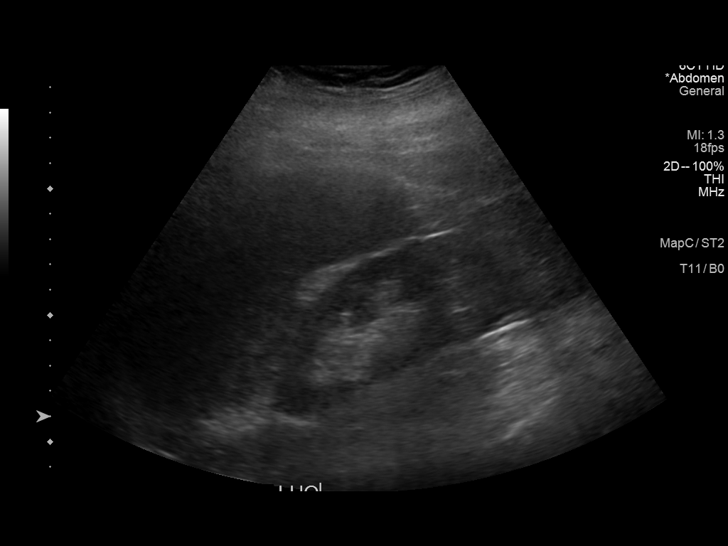
[im 8/12]
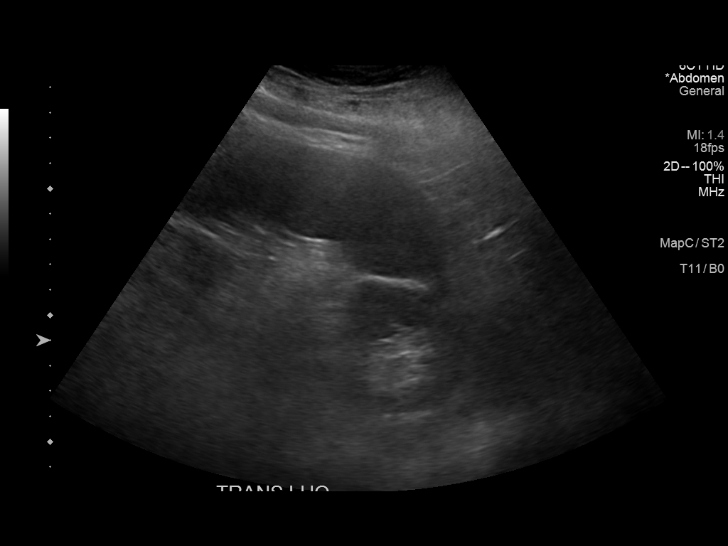
[im 9/12]
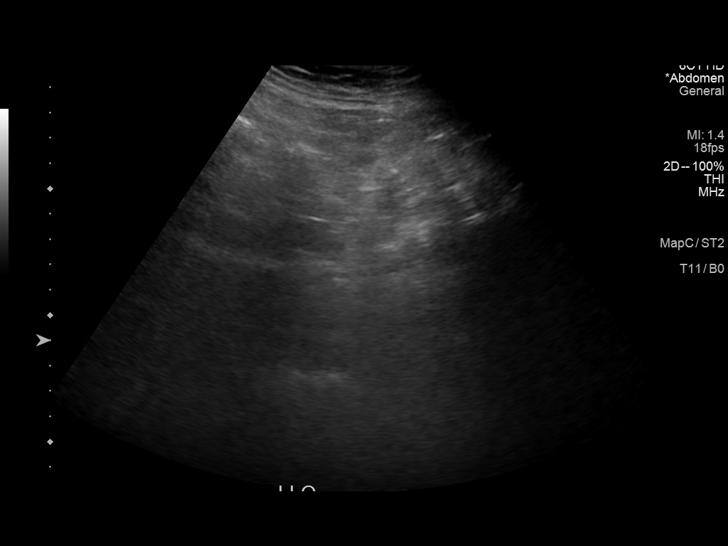
[im 10/12]
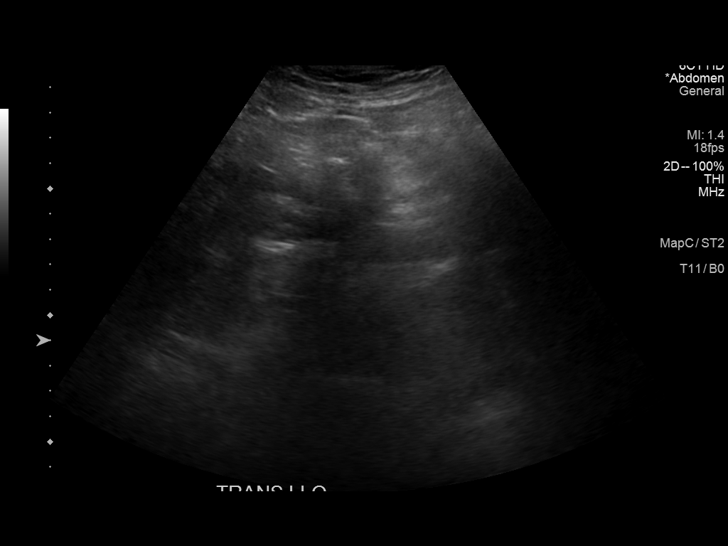
[im 11/12]
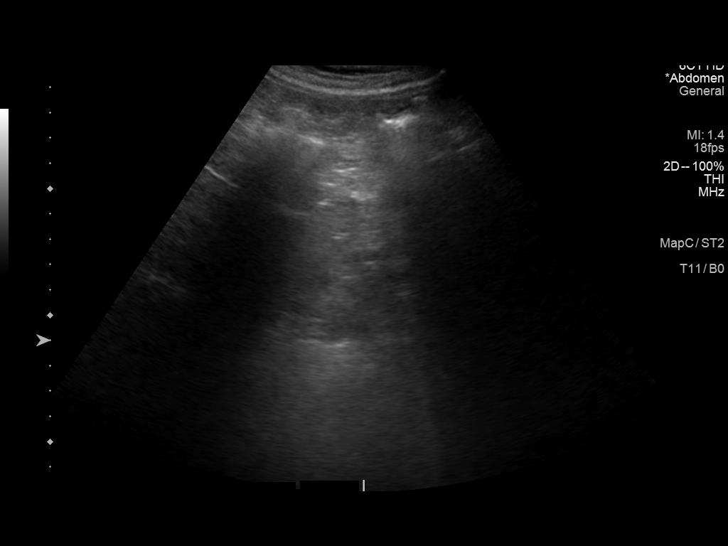
[im 12/12]
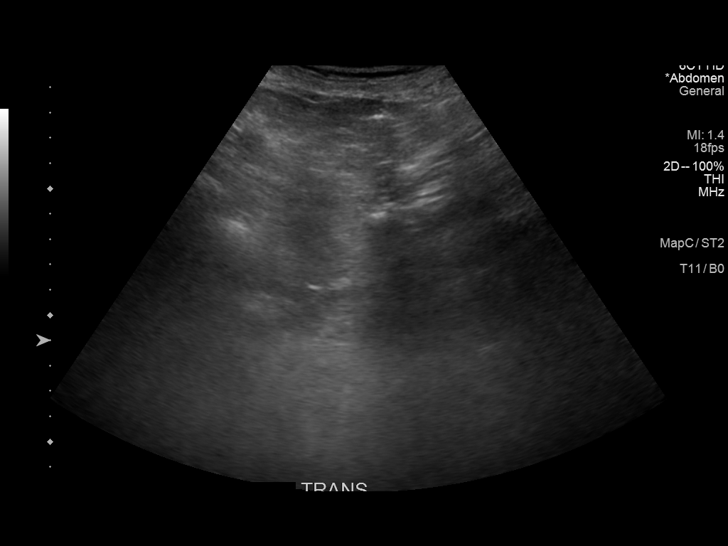

[12 of 12 positions shown; findings below may reference images not displayed]

FINDINGS: No ascites identified.

Echogenic and nodular appearing liver in keeping with known
cirrhosis.
IMPRESSION: No ascites.

## 2020-08-18 DEATH — deceased

## 2020-12-02 IMAGING — US US ABDOMEN COMPLETE
1 series · 13 of 25 positions shown · non-contrast
Comparison: Ultrasound dated 05/01/2019

CLINICAL DATA: Alcoholic cirrhosis.

EXAM:
ABDOMEN ULTRASOUND COMPLETE

[Series 1: us abdomen complete · 0.28mm/px · 13 of 74 slices shown]
[im 1/74]
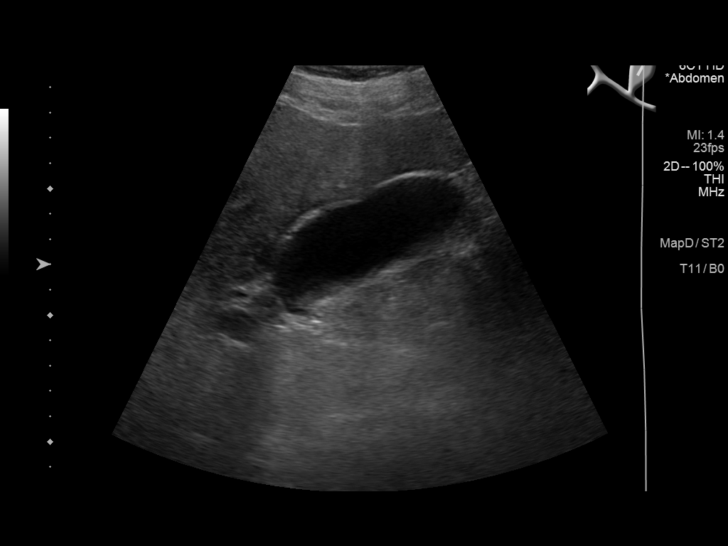
[im 7/74]
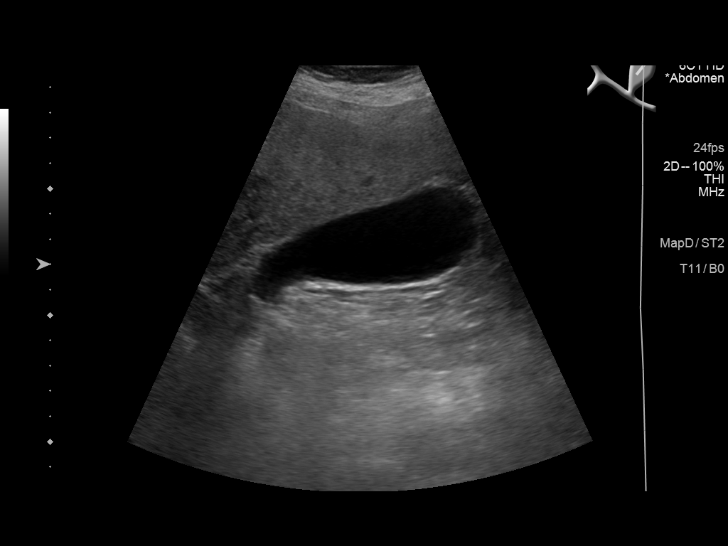
[im 13/74]
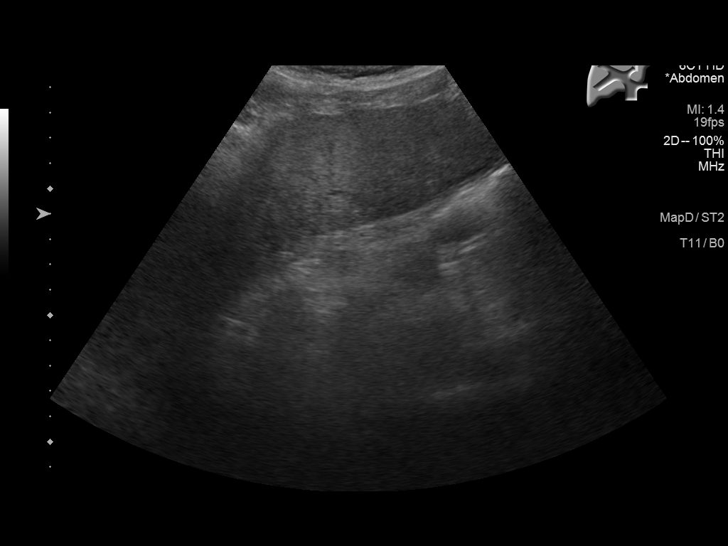
[im 19/74]
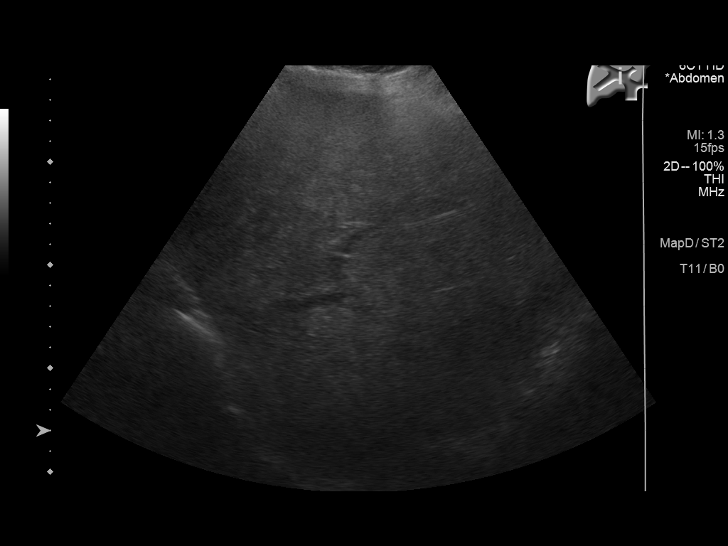
[im 25/74]
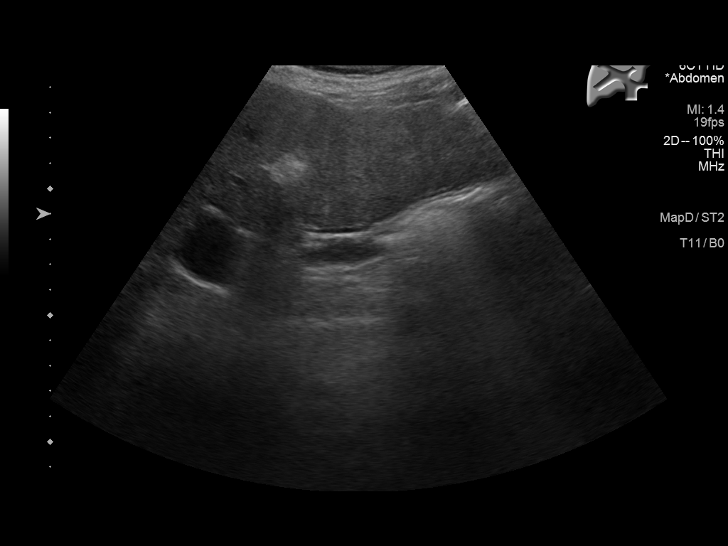
[im 31/74]
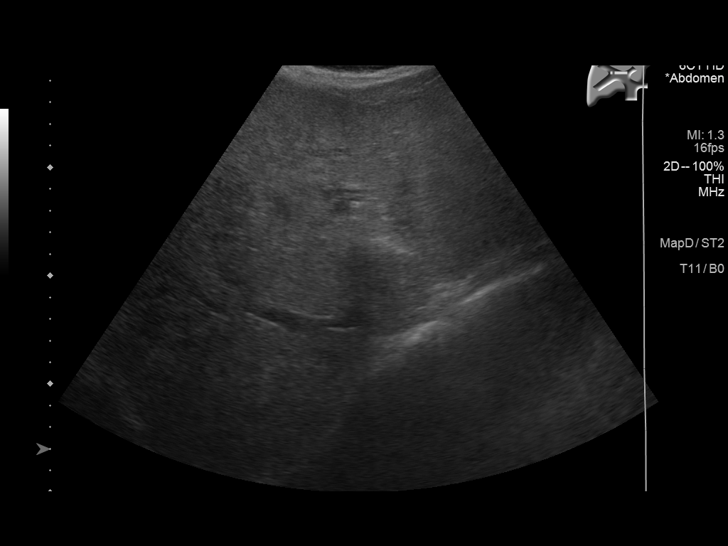
[im 37/74]
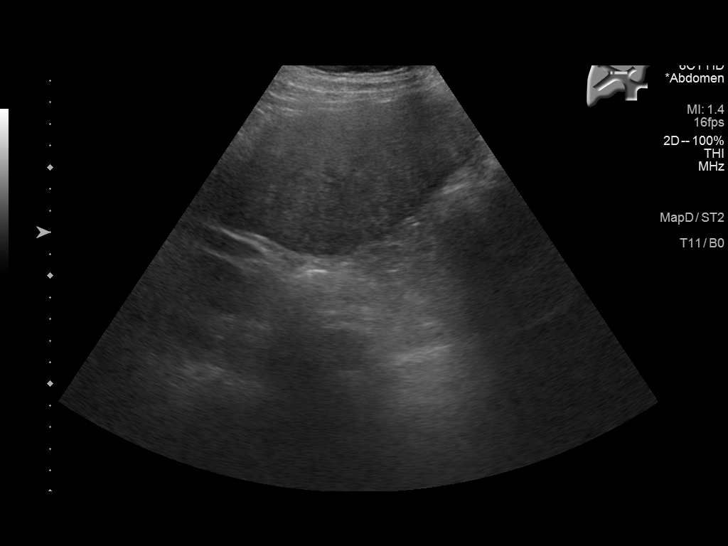
[im 43/74]
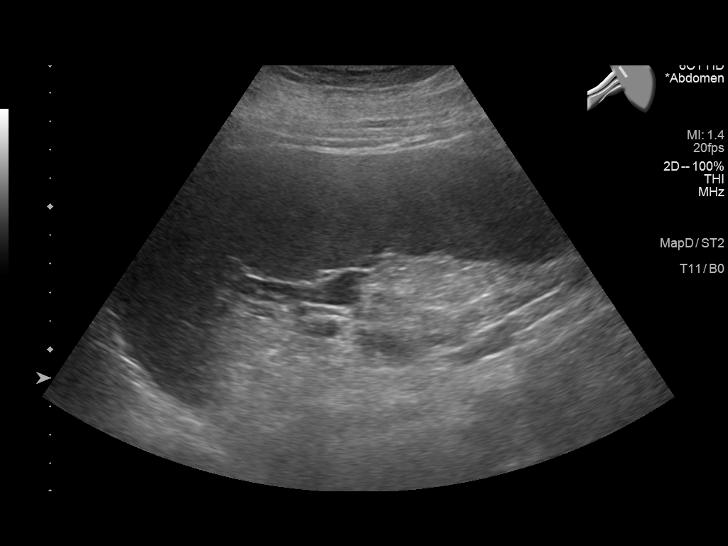
[im 49/74]
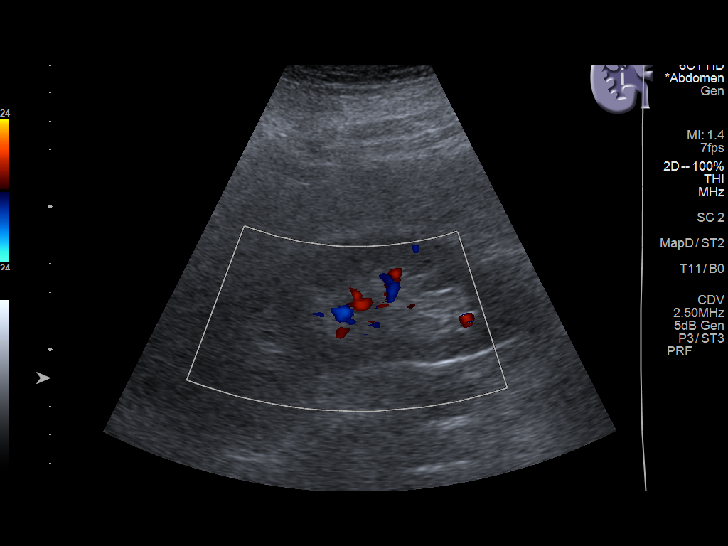
[im 55/74]
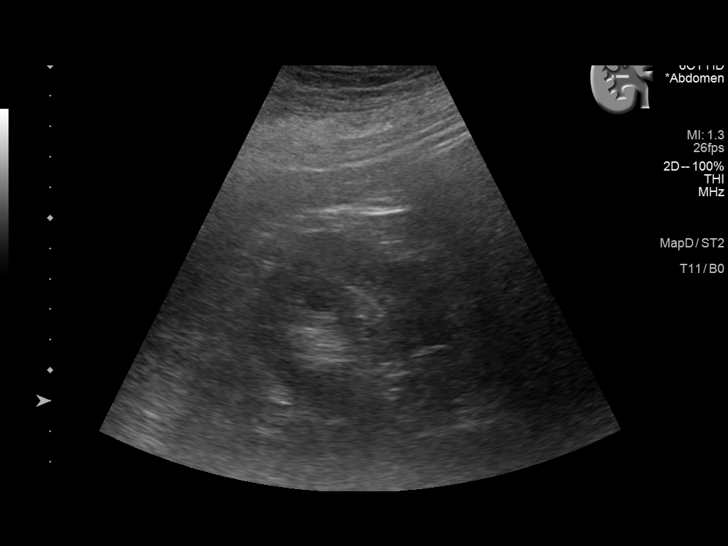
[im 61/74]
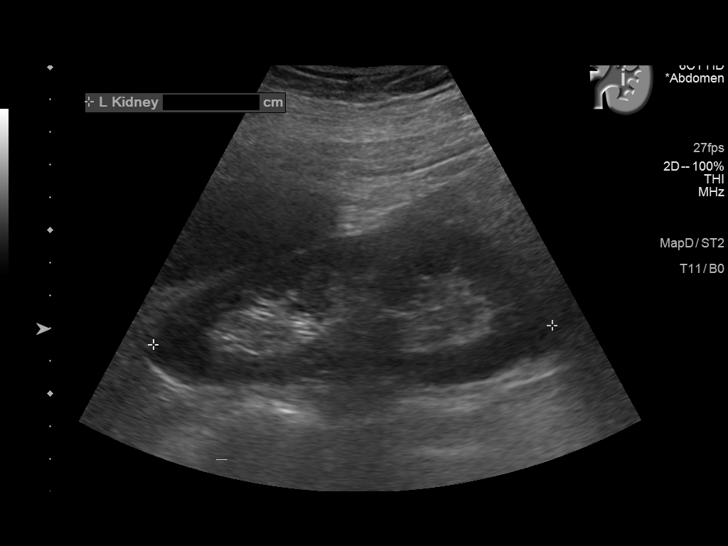
[im 67/74]
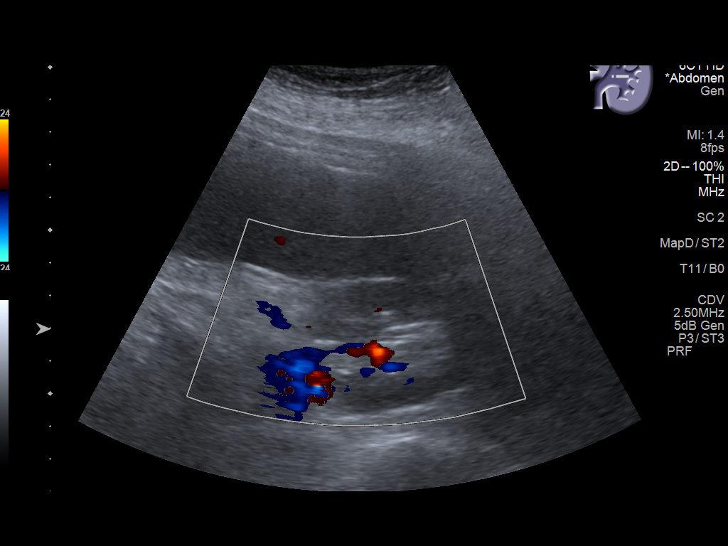
[im 74/74]
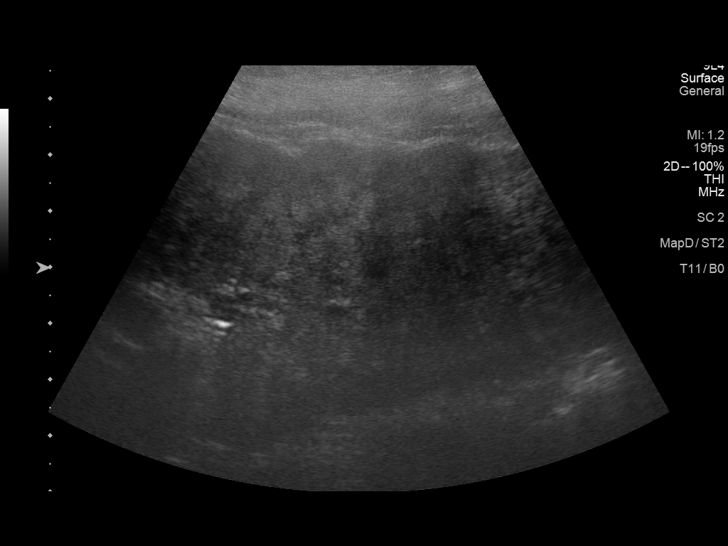

[13 of 25 positions shown; findings below may reference images not displayed]

FINDINGS: Gallbladder: No gallstones or wall thickening visualized. No
sonographic Murphy sign noted by sonographer.

Common bile duct: Diameter: 4.8 mm, normal.

Liver: No focal lesion identified. Hepatomegaly. Diffuse increased
echogenicity of the liver parenchyma with slight nodularity of the
liver contour. Portal vein is patent on color Doppler imaging with
normal direction of blood flow towards the liver.

IVC: No abnormality visualized.

Pancreas: Not visualized.

Spleen: 14.8 cm in length. Splenomegaly with a volume of 733 cubic
cm.

Right Kidney: Length: 12.4 cm. Echogenicity within normal limits. No
mass or hydronephrosis visualized.

Left Kidney: Length: 12.2 cm. Echogenicity within normal limits. No
mass or hydronephrosis visualized.

Abdominal aorta: No aneurysm visualized.

Other findings: None.
IMPRESSION: Hepatosplenomegaly with increased echogenicity of the liver
parenchyma consistent with hepatic steatosis. Nodularity of the
liver contour is consistent with cirrhosis. No focal liver lesions.
No significant change since the prior study.
